# Patient Record
Sex: Female | Born: 1960 | Race: Black or African American | Hispanic: No | Marital: Single | Smoking: Never smoker
Health system: Southern US, Community
[De-identification: ages and names within clinical notes are randomized; demographics above are authoritative.]

## PROBLEM LIST (undated history)

## (undated) DIAGNOSIS — I10 Essential (primary) hypertension: Secondary | ICD-10-CM

## (undated) DIAGNOSIS — E669 Obesity, unspecified: Secondary | ICD-10-CM

## (undated) DIAGNOSIS — M1990 Unspecified osteoarthritis, unspecified site: Secondary | ICD-10-CM

## (undated) HISTORY — PX: NO PAST SURGERIES: SHX2092

## (undated) HISTORY — DX: Unspecified osteoarthritis, unspecified site: M19.90

---

## 2006-09-18 ENCOUNTER — Other Ambulatory Visit: Payer: Self-pay

## 2006-09-18 ENCOUNTER — Emergency Department: Payer: Self-pay | Admitting: Emergency Medicine

## 2006-09-27 ENCOUNTER — Ambulatory Visit: Payer: Self-pay | Admitting: Internal Medicine

## 2006-09-30 ENCOUNTER — Ambulatory Visit: Payer: Self-pay | Admitting: Internal Medicine

## 2008-11-13 ENCOUNTER — Emergency Department: Payer: Self-pay

## 2010-07-04 ENCOUNTER — Ambulatory Visit: Payer: Self-pay | Admitting: Family Medicine

## 2010-07-10 ENCOUNTER — Ambulatory Visit: Payer: Self-pay | Admitting: Family Medicine

## 2011-08-15 ENCOUNTER — Emergency Department: Payer: Self-pay | Admitting: Emergency Medicine

## 2011-08-15 LAB — URINALYSIS, COMPLETE
Bacteria: NONE SEEN
Bilirubin,UR: NEGATIVE
Glucose,UR: NEGATIVE mg/dL (ref 0–75)
Ketone: NEGATIVE
Nitrite: NEGATIVE
Ph: 5 (ref 4.5–8.0)
Specific Gravity: 1.019 (ref 1.003–1.030)
Squamous Epithelial: 7

## 2011-08-15 LAB — BASIC METABOLIC PANEL
Anion Gap: 12 (ref 7–16)
BUN: 10 mg/dL (ref 7–18)
Chloride: 103 mmol/L (ref 98–107)
Co2: 27 mmol/L (ref 21–32)
Creatinine: 0.87 mg/dL (ref 0.60–1.30)
Glucose: 97 mg/dL (ref 65–99)
Potassium: 3.5 mmol/L (ref 3.5–5.1)

## 2011-08-15 LAB — CBC
HCT: 42.2 % (ref 35.0–47.0)
MCH: 28.5 pg (ref 26.0–34.0)
MCHC: 32.4 g/dL (ref 32.0–36.0)
Platelet: 305 10*3/uL (ref 150–440)
RDW: 15.5 % — ABNORMAL HIGH (ref 11.5–14.5)

## 2011-12-19 ENCOUNTER — Ambulatory Visit: Payer: Self-pay

## 2012-01-17 ENCOUNTER — Ambulatory Visit: Payer: Self-pay

## 2012-01-22 ENCOUNTER — Ambulatory Visit: Payer: Self-pay

## 2012-07-31 IMAGING — US ULTRASOUND LEFT BREAST
1 series · 14 of 25 positions shown · non-contrast
Comparison: none

REASON FOR EXAM: us asymmetric density [REDACTED]
COMMENTS:

[Series 1: ultrasound left breast · 0.09mm/px · 35 acquisitions, 14 frames shown]
[im 1/35]
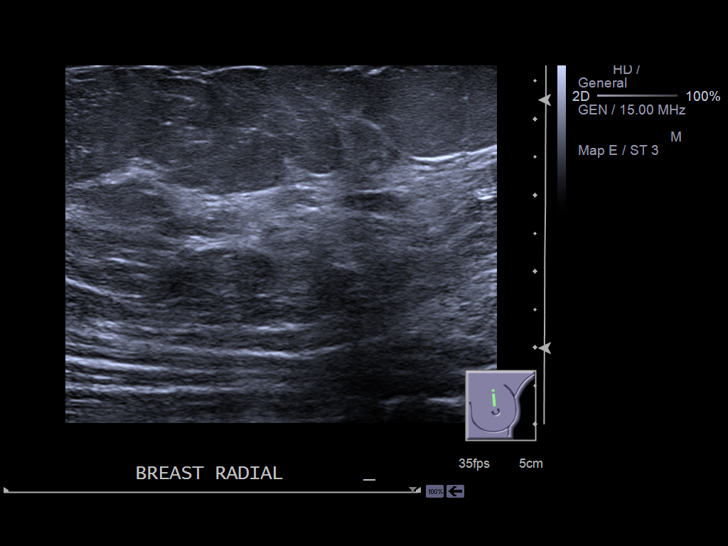
[im 3/35]
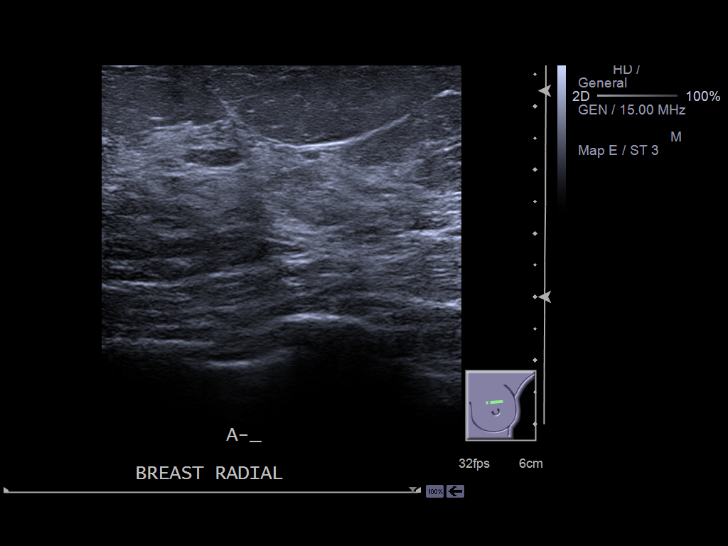
[im 6/35]
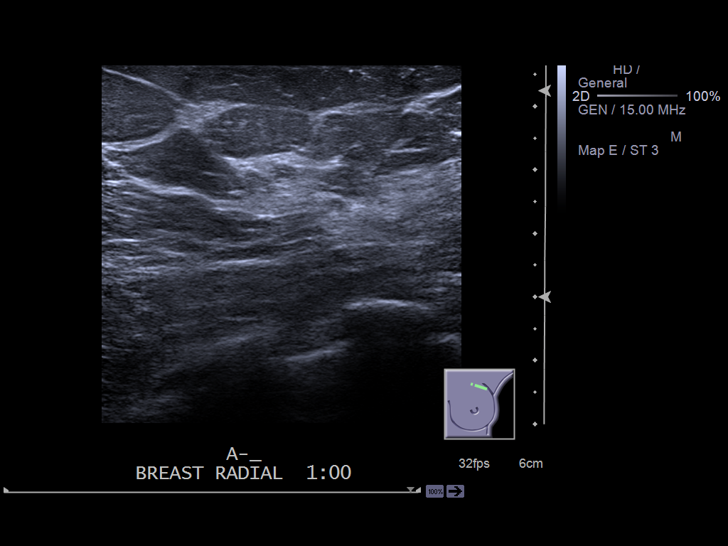
[im 9/35]
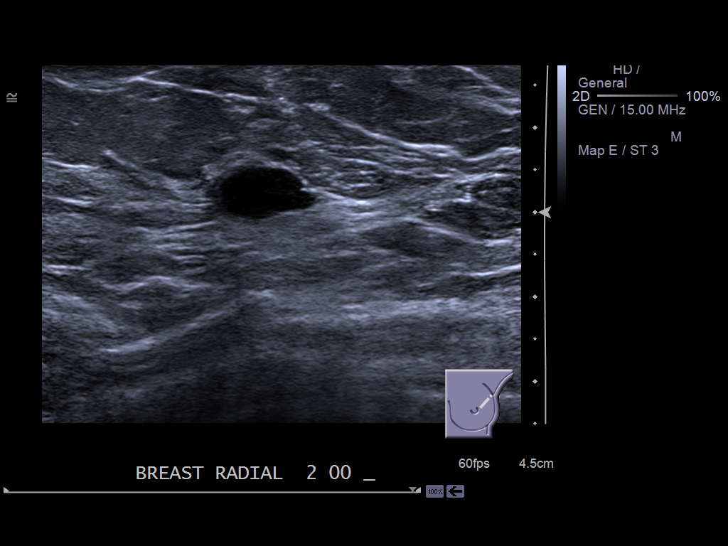
[im 12/35]
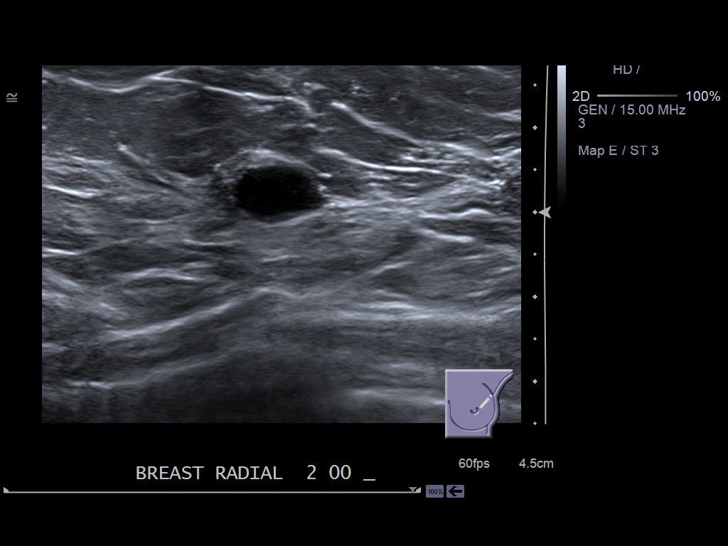
[im 13/35]
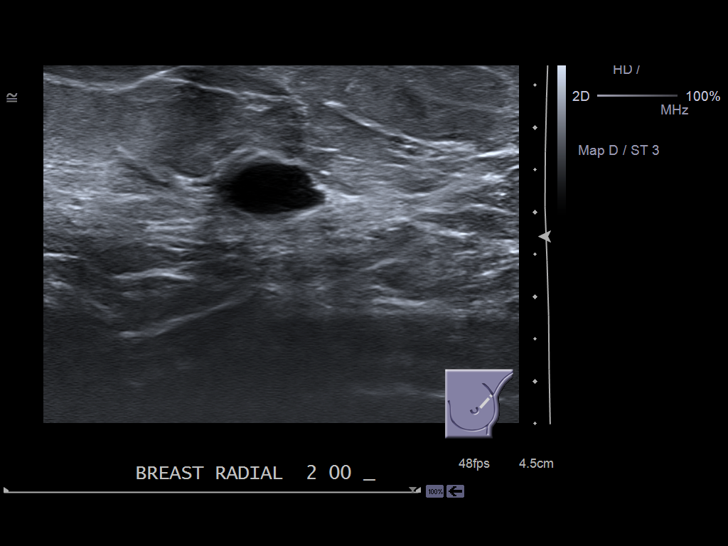
[im 16/35]
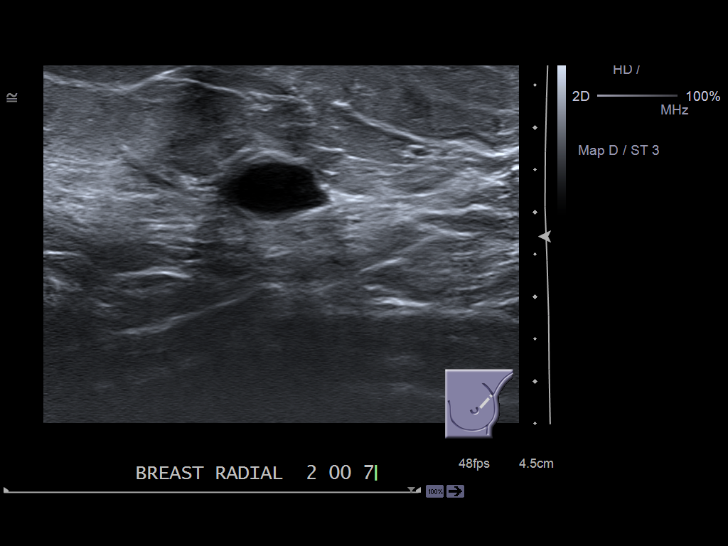
[im 19/35]
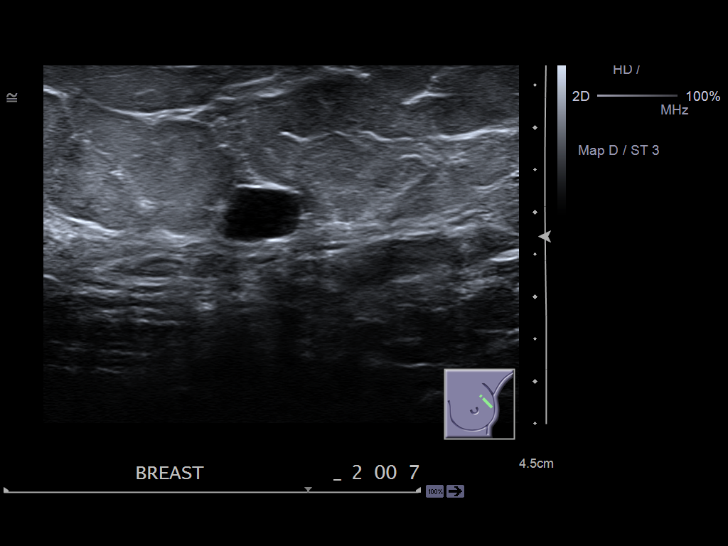
[im 22/35]
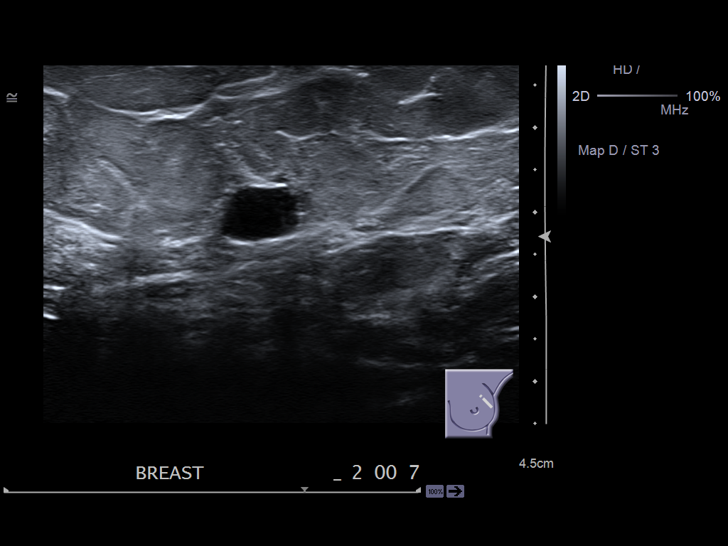
[im 23/35]
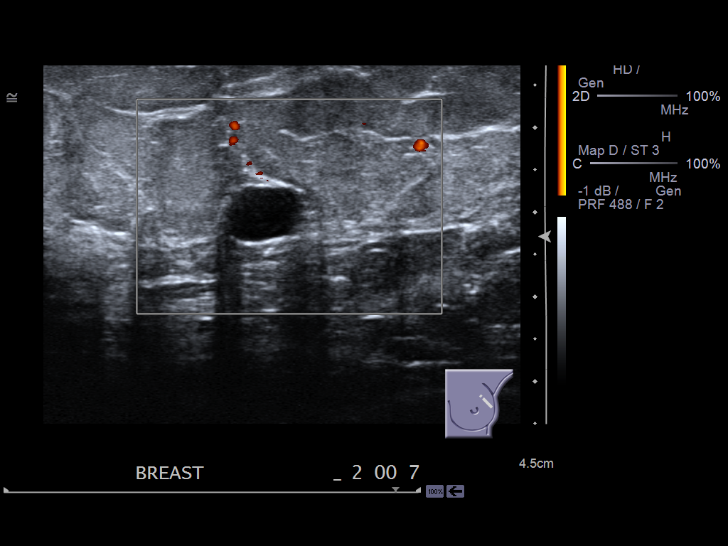
[im 26/35]
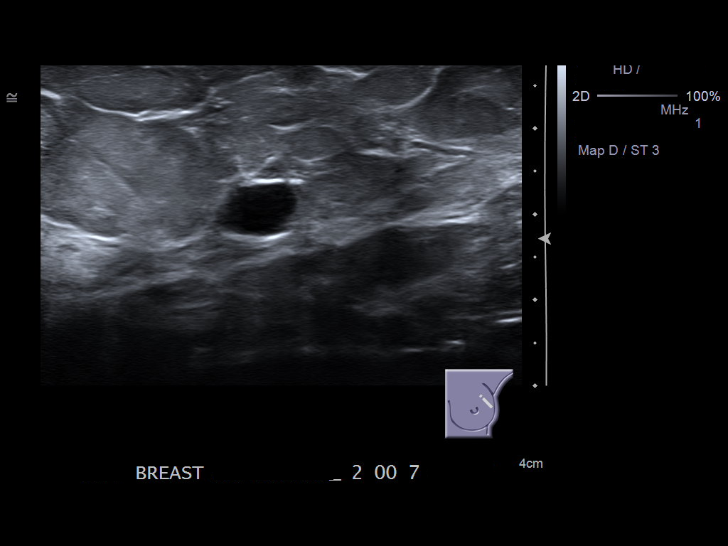
[im 29/35]
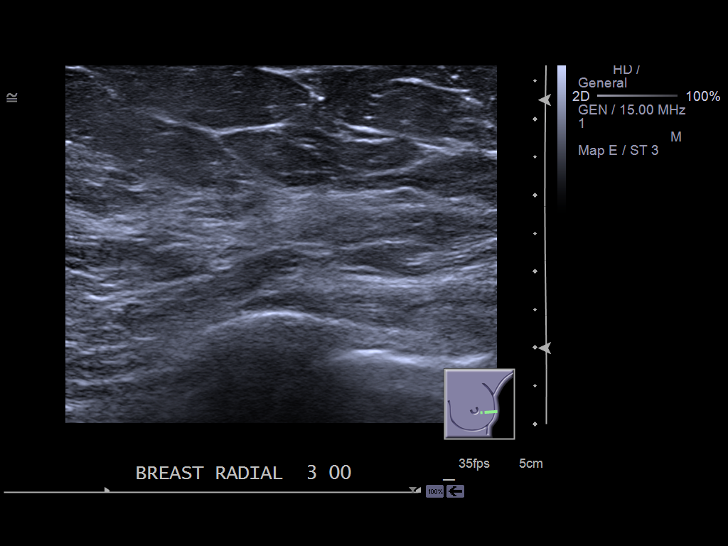
[im 32/35]
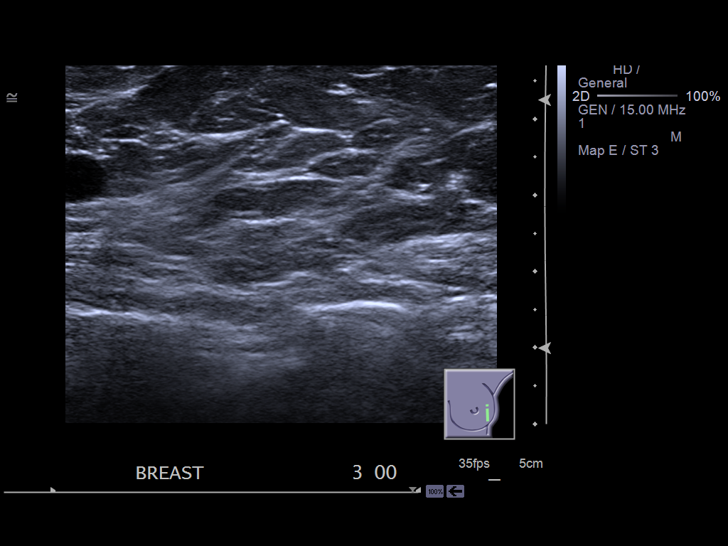
[im 35/35]
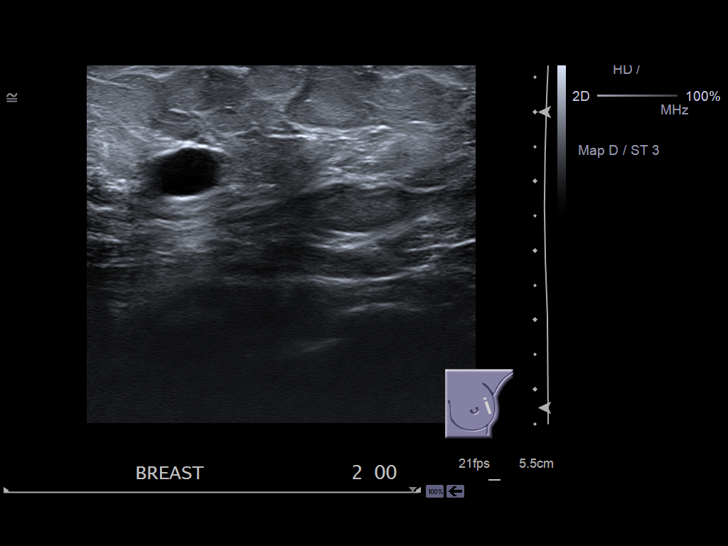

[14 of 25 positions shown; findings below may reference images not displayed]

PROCEDURE:     US  - US LT BREAST ([REDACTED])  - January 22, 2012 [DATE]

RESULT:     The left breast is evaluated from the 12 o'clock to the 3
o'clock position.

At the 2 o'clock position a hypoechoic to anechoic nodule is identified.
There does not appear to be increased through transmission associated with
this nodule nor is there evidence of flow. This nodule has the appearance of
a benign cyst though sonographic characteristics are not pathognomonic.
Please refer to the additional left breast mammogram for completed
discussion.
IMPRESSION: Cystic appearing area likely benign measuring 1.19 x 0.65 x
0.92 cm at the 2 o'clock position as described above.

## 2012-09-05 ENCOUNTER — Ambulatory Visit: Payer: Self-pay | Admitting: Family Medicine

## 2013-01-15 DIAGNOSIS — R600 Localized edema: Secondary | ICD-10-CM | POA: Insufficient documentation

## 2013-01-15 DIAGNOSIS — S86899A Other injury of other muscle(s) and tendon(s) at lower leg level, unspecified leg, initial encounter: Secondary | ICD-10-CM | POA: Insufficient documentation

## 2013-04-08 ENCOUNTER — Ambulatory Visit: Payer: Self-pay

## 2013-04-29 ENCOUNTER — Emergency Department: Payer: Self-pay | Admitting: Emergency Medicine

## 2013-04-29 LAB — CBC
HGB: 13.8 g/dL (ref 12.0–16.0)
MCH: 29.5 pg (ref 26.0–34.0)
MCHC: 33.9 g/dL (ref 32.0–36.0)
MCV: 87 fL (ref 80–100)
Platelet: 270 10*3/uL (ref 150–440)
RBC: 4.67 10*6/uL (ref 3.80–5.20)
RDW: 14.4 % (ref 11.5–14.5)
WBC: 6.2 10*3/uL (ref 3.6–11.0)

## 2013-04-29 LAB — COMPREHENSIVE METABOLIC PANEL
Albumin: 3.3 g/dL — ABNORMAL LOW (ref 3.4–5.0)
Anion Gap: 6 — ABNORMAL LOW (ref 7–16)
Bilirubin,Total: 0.3 mg/dL (ref 0.2–1.0)
Calcium, Total: 9.1 mg/dL (ref 8.5–10.1)
Chloride: 105 mmol/L (ref 98–107)
Co2: 26 mmol/L (ref 21–32)
EGFR (Non-African Amer.): >60
Osmolality: 275 (ref 275–301)
Potassium: 3.4 mmol/L — ABNORMAL LOW (ref 3.5–5.1)
SGOT(AST): 30 U/L (ref 15–37)
SGPT (ALT): 32 U/L (ref 12–78)
Sodium: 137 mmol/L (ref 136–145)
Total Protein: 7.7 g/dL (ref 6.4–8.2)

## 2013-04-29 LAB — APTT: Activated PTT: 33.1 secs (ref 23.6–35.9)

## 2013-04-29 LAB — PROTIME-INR: INR: 1

## 2013-05-07 ENCOUNTER — Emergency Department: Payer: Self-pay | Admitting: Emergency Medicine

## 2013-05-07 LAB — CBC
HGB: 13.7 g/dL (ref 12.0–16.0)
MCH: 28.8 pg (ref 26.0–34.0)
MCV: 87 fL (ref 80–100)
Platelet: 322 10*3/uL (ref 150–440)
RBC: 4.75 10*6/uL (ref 3.80–5.20)
WBC: 12.3 10*3/uL — ABNORMAL HIGH (ref 3.6–11.0)

## 2013-05-07 LAB — COMPREHENSIVE METABOLIC PANEL
Alkaline Phosphatase: 74 U/L (ref 50–136)
BUN: 12 mg/dL (ref 7–18)
Bilirubin,Total: 0.4 mg/dL (ref 0.2–1.0)
Chloride: 106 mmol/L (ref 98–107)
Co2: 25 mmol/L (ref 21–32)
Creatinine: 1.09 mg/dL (ref 0.60–1.30)
EGFR (African American): >60
Glucose: 135 mg/dL — ABNORMAL HIGH (ref 65–99)
Osmolality: 276 (ref 275–301)
SGOT(AST): 101 U/L — ABNORMAL HIGH (ref 15–37)
SGPT (ALT): 119 U/L — ABNORMAL HIGH (ref 12–78)
Sodium: 137 mmol/L (ref 136–145)
Total Protein: 8.8 g/dL — ABNORMAL HIGH (ref 6.4–8.2)

## 2013-05-07 LAB — URINALYSIS, COMPLETE
Bilirubin,UR: NEGATIVE
Nitrite: NEGATIVE
Ph: 5 (ref 4.5–8.0)
Protein: 100
Specific Gravity: 1.028 (ref 1.003–1.030)
Squamous Epithelial: 3

## 2013-05-11 ENCOUNTER — Emergency Department: Payer: Self-pay | Admitting: Emergency Medicine

## 2013-05-11 LAB — COMPREHENSIVE METABOLIC PANEL
Albumin: 3.6 g/dL (ref 3.4–5.0)
Alkaline Phosphatase: 76 U/L (ref 50–136)
BUN: 16 mg/dL (ref 7–18)
Bilirubin,Total: 0.5 mg/dL (ref 0.2–1.0)
Co2: 25 mmol/L (ref 21–32)
Creatinine: 1.19 mg/dL (ref 0.60–1.30)
EGFR (African American): >60
EGFR (Non-African Amer.): 53 — ABNORMAL LOW
Glucose: 112 mg/dL — ABNORMAL HIGH (ref 65–99)
SGPT (ALT): 409 U/L — ABNORMAL HIGH (ref 12–78)
Sodium: 136 mmol/L (ref 136–145)

## 2013-05-11 LAB — CBC
HCT: 46.9 % (ref 35.0–47.0)
HGB: 15.5 g/dL (ref 12.0–16.0)
MCH: 28.3 pg (ref 26.0–34.0)
MCHC: 32.9 g/dL (ref 32.0–36.0)
RBC: 5.46 10*6/uL — ABNORMAL HIGH (ref 3.80–5.20)
RDW: 14.4 % (ref 11.5–14.5)

## 2013-05-11 LAB — HCG, QUANTITATIVE, PREGNANCY: Beta Hcg, Quant.: <1 m[IU]/mL — ABNORMAL LOW

## 2013-05-11 LAB — WET PREP, GENITAL

## 2014-06-02 ENCOUNTER — Ambulatory Visit: Payer: Self-pay

## 2014-12-07 ENCOUNTER — Ambulatory Visit
Admit: 2014-12-07 | Disposition: A | Discharge status: Home / Self Care | Payer: Self-pay | Attending: Family Medicine | Admitting: Family Medicine

## 2015-02-09 ENCOUNTER — Encounter: Payer: Self-pay | Admitting: Obstetrics and Gynecology

## 2015-05-02 ENCOUNTER — Encounter: Payer: Self-pay | Admitting: Emergency Medicine

## 2015-05-02 ENCOUNTER — Emergency Department
Admission: EM | Admit: 2015-05-02 | Discharge: 2015-05-02 | Disposition: A | Discharge status: Home / Self Care | Payer: No Typology Code available for payment source | Attending: Student | Admitting: Student

## 2015-05-02 DIAGNOSIS — L259 Unspecified contact dermatitis, unspecified cause: Secondary | ICD-10-CM | POA: Insufficient documentation

## 2015-05-02 DIAGNOSIS — Z79899 Other long term (current) drug therapy: Secondary | ICD-10-CM | POA: Insufficient documentation

## 2015-05-02 DIAGNOSIS — I10 Essential (primary) hypertension: Secondary | ICD-10-CM | POA: Insufficient documentation

## 2015-05-02 DIAGNOSIS — R220 Localized swelling, mass and lump, head: Secondary | ICD-10-CM | POA: Diagnosis present

## 2015-05-02 DIAGNOSIS — L03211 Cellulitis of face: Secondary | ICD-10-CM | POA: Diagnosis not present

## 2015-05-02 HISTORY — DX: Essential (primary) hypertension: I10

## 2015-05-02 MED ORDER — TRAMADOL HCL 50 MG PO TABS: 50.0000 mg | ORAL_TABLET | Freq: Four times a day (QID) | ORAL | Status: DC | PRN

## 2015-05-02 MED ORDER — IBUPROFEN 800 MG PO TABS: 800.0000 mg | ORAL_TABLET | Freq: Three times a day (TID) | ORAL | Status: DC | PRN

## 2015-05-02 MED ORDER — HYDROCORTISONE VALERATE 0.2 % EX OINT: TOPICAL_OINTMENT | CUTANEOUS | Status: AC

## 2015-05-02 MED ORDER — HYDROXYZINE HCL 50 MG PO TABS: 50.0000 mg | ORAL_TABLET | Freq: Three times a day (TID) | ORAL | Status: DC | PRN

## 2015-05-02 MED ORDER — SULFAMETHOXAZOLE-TRIMETHOPRIM 800-160 MG PO TABS: 1.0000 | ORAL_TABLET | Freq: Two times a day (BID) | ORAL | Status: DC

## 2015-05-02 NOTE — ED Notes (Signed)
Pt to ed with c/o swelling to right side of face near nose.  Pt with redness noted.  Tender to touch.  Pt also reports rash on neck x 2 weeks.

## 2015-05-02 NOTE — ED Notes (Signed)
Redness and swelling noted to right side of face for the past couple of days. Also swelling noted to gumline. No fever

## 2015-05-02 NOTE — ED Provider Notes (Signed)
[facility] Emergency Department Provider Note  ____________________________________________  Time seen: Approximately 9:02 AM  I have reviewed the triage vital signs and the nursing notes.   HISTORY  Chief Complaint Facial Swelling    HPI Leah Stephens is a 54 y.o. female patient is complaining of onset of right-sided facial edema and redness. Patient stated onset was yesterday. Patient denies any provocative incident for this complaint. Patient denies dental pain.  Patient also concern about a rash around the neck. Patient has circular rash which is itching. Patient denies wearing any jewelry but states she weighs air pulse attention by a plastic or nylon lesions. Patient states that the rash itches and has no discharge. Patient states use over-the-counter hydrocortisone cream to the rash.   Past Medical History  Diagnosis Date  . Hypertension     There are no active problems to display for this patient.   History reviewed. No pertinent past surgical history.  Current Outpatient Rx  Name  Route  Sig  Dispense  Refill  . lisinopril (PRINIVIL,ZESTRIL) 20 MG tablet   Oral   Take 20 mg by mouth daily.           Allergies Review of patient's allergies indicates no known allergies.  History reviewed. No pertinent family history.  Social History Social History  Substance Use Topics  . Smoking status: Never Smoker   . Smokeless tobacco: None  . Alcohol Use: Yes    Review of Systems Constitutional: No fever/chills Eyes: No visual changes. ENT: No sore throat. Cardiovascular: Denies chest pain. Respiratory: Denies shortness of breath. Gastrointestinal: No abdominal pain.  No nausea, no vomiting.  No diarrhea.  No constipation. Genitourinary: Negative for dysuria. Musculoskeletal: Negative for back pain. Skin:  right maxillary area and a rash around the neck. Neurological: Negative for headaches, focal weakness or numbness. 10-point ROS  otherwise negative.  ____________________________________________   PHYSICAL EXAM:  VITAL SIGNS: ED Triage Vitals  Enc Vitals Group     BP 05/02/15 0842 136/74 mmHg     Pulse Rate 05/02/15 0842 72     Resp 05/02/15 0842 20     Temp 05/02/15 0842 98.5 F (36.9 C)     Temp Source 05/02/15 0842 Oral     SpO2 05/02/15 0842 100 %     Weight 05/02/15 0842 230 lb (104.327 kg)     Height 05/02/15 0842 4\' 11"  (1.499 m)     Head Cir --      Peak Flow --      Pain Score 05/02/15 0842 10     Pain Loc --      Pain Edu? --      Excl. in [place]? --     Constitutional: Alert and oriented. Well appearing and in no acute distress. Eyes: Conjunctivae are normal. PERRL. EOMI. Head: Atraumatic. Nose: No congestion/rhinnorhea. Mouth/Throat: Mucous membranes are moist.  Oropharynx non-erythematous. Neck: No stridor.   Cardiovascular: Normal rate, regular rhythm. Grossly normal heart sounds.  Good peripheral circulation. Respiratory: Normal respiratory effort.  No retractions. Lungs CTAB. Gastrointestinal: Soft and nontender. No distention. No abdominal bruits. No CVA tenderness. Musculoskeletal: No lower extremity tenderness nor edema.  No joint effusions. Neurologic:  Normal speech and language. No gross focal neurologic deficits are appreciated. No gait instability. Skin: Erythema and edema to the right maxillary area. Moderate guarding palpation. Patient also has a macular lesions comfortable of the posterior and bilateral areas of the neck. Psychiatric: Mood and affect are normal. Speech and  behavior are normal.  ____________________________________________   LABS (all labs ordered are listed, but only abnormal results are displayed)  Labs Reviewed - No data to display ____________________________________________  EKG   ____________________________________________  RADIOLOGY   ____________________________________________   PROCEDURES  Procedure(s) performed: None  Critical  Care performed: No  ____________________________________________   INITIAL IMPRESSION / ASSESSMENT AND PLAN / ED COURSE  Pertinent labs & imaging results that were available during my care of the patient were reviewed by me and considered in my medical decision making (see chart for details). Right facial cellulitis and contact dermatitis to the neck. Patient given a prescription for Bactrim, tramadol, Atarax, and hydrocortisone. Patient advised to cut the least attaches to earplugs. Patient given a work excuse for today. [facility]. ____________________________________________   FINAL CLINICAL IMPRESSION(S) / ED DIAGNOSES  Final diagnoses:  Facial cellulitis  Contact dermatitis      Sable Feil, PA-C 05/02/15 [id number]  Joanne Gavel, MD 05/02/15 1635

## 2015-05-03 ENCOUNTER — Emergency Department
Admission: EM | Admit: 2015-05-03 | Discharge: 2015-05-03 | Disposition: A | Discharge status: Home / Self Care | Payer: No Typology Code available for payment source | Attending: Emergency Medicine | Admitting: Emergency Medicine

## 2015-05-03 ENCOUNTER — Emergency Department: Payer: No Typology Code available for payment source

## 2015-05-03 ENCOUNTER — Telehealth: Payer: Self-pay | Admitting: Emergency Medicine

## 2015-05-03 ENCOUNTER — Encounter: Payer: Self-pay | Admitting: Emergency Medicine

## 2015-05-03 DIAGNOSIS — I10 Essential (primary) hypertension: Secondary | ICD-10-CM | POA: Diagnosis not present

## 2015-05-03 DIAGNOSIS — R220 Localized swelling, mass and lump, head: Secondary | ICD-10-CM | POA: Diagnosis present

## 2015-05-03 DIAGNOSIS — R609 Edema, unspecified: Secondary | ICD-10-CM

## 2015-05-03 DIAGNOSIS — R21 Rash and other nonspecific skin eruption: Secondary | ICD-10-CM | POA: Diagnosis not present

## 2015-05-03 DIAGNOSIS — L0201 Cutaneous abscess of face: Secondary | ICD-10-CM | POA: Insufficient documentation

## 2015-05-03 DIAGNOSIS — Z79899 Other long term (current) drug therapy: Secondary | ICD-10-CM | POA: Diagnosis not present

## 2015-05-03 LAB — BASIC METABOLIC PANEL
ANION GAP: 8 (ref 5–15)
BUN: 12 mg/dL (ref 6–20)
CO2: 25 mmol/L (ref 22–32)
Calcium: 9.6 mg/dL (ref 8.9–10.3)
Chloride: 105 mmol/L (ref 101–111)
Creatinine, Ser: 1 mg/dL (ref 0.44–1.00)
GFR calc Af Amer: >60 mL/min (ref >60)
GFR calc non Af Amer: >60 mL/min (ref >60)
GLUCOSE: 94 mg/dL (ref 65–99)
POTASSIUM: 3.8 mmol/L (ref 3.5–5.1)
Sodium: 138 mmol/L (ref 135–145)

## 2015-05-03 LAB — CBC WITH DIFFERENTIAL/PLATELET
BASOS ABS: 0.1 10*3/uL (ref 0–0.1)
Basophils Relative: 1 %
Eosinophils Absolute: 0.1 10*3/uL (ref 0–0.7)
Eosinophils Relative: 1 %
HEMATOCRIT: 45 % (ref 35.0–47.0)
Hemoglobin: 14.7 g/dL (ref 12.0–16.0)
LYMPHS PCT: 18 %
Lymphs Abs: 2.1 10*3/uL (ref 1.0–3.6)
MCH: 28.2 pg (ref 26.0–34.0)
MCHC: 32.7 g/dL (ref 32.0–36.0)
MCV: 86.3 fL (ref 80.0–100.0)
MONO ABS: 1.1 10*3/uL — AB (ref 0.2–0.9)
Monocytes Relative: 9 %
NEUTROS ABS: 8.5 10*3/uL — AB (ref 1.4–6.5)
Neutrophils Relative %: 71 %
Platelets: 269 10*3/uL (ref 150–440)
RBC: 5.21 MIL/uL — AB (ref 3.80–5.20)
RDW: 15 % — ABNORMAL HIGH (ref 11.5–14.5)
WBC: 11.9 10*3/uL — AB (ref 3.6–11.0)

## 2015-05-03 MED ORDER — IOHEXOL 300 MG/ML  SOLN
75.0000 mL | Freq: Once | INTRAMUSCULAR | Status: AC | PRN
  Administered 2015-05-03: 75 mL via INTRAVENOUS

## 2015-05-03 MED ORDER — CLINDAMYCIN HCL 300 MG PO CAPS: 300.0000 mg | ORAL_CAPSULE | Freq: Three times a day (TID) | ORAL | Status: DC

## 2015-05-03 MED ORDER — LIDOCAINE-EPINEPHRINE 1 %-1:100000 IJ SOLN
20.0000 mL | Freq: Once | INTRAMUSCULAR | Status: AC
  Administered 2015-05-03: 20 mL
  Filled 2015-05-03: qty 20

## 2015-05-03 MED ORDER — CLINDAMYCIN PHOSPHATE 600 MG/50ML IV SOLN
600.0000 mg | Freq: Once | INTRAVENOUS | Status: AC
  Administered 2015-05-03: 600 mg via INTRAVENOUS
  Filled 2015-05-03: qty 50

## 2015-05-03 MED ORDER — OXYCODONE-ACETAMINOPHEN 5-325 MG PO TABS: 1.0000 | ORAL_TABLET | Freq: Four times a day (QID) | ORAL | Status: DC | PRN

## 2015-05-03 MED ORDER — OXYCODONE-ACETAMINOPHEN 5-325 MG PO TABS
1.0000 | ORAL_TABLET | Freq: Once | ORAL | Status: AC
  Administered 2015-05-03: 1 via ORAL
  Filled 2015-05-03: qty 1

## 2015-05-03 MED ORDER — MORPHINE SULFATE (PF) 2 MG/ML IV SOLN
2.0000 mg | Freq: Once | INTRAVENOUS | Status: AC
  Administered 2015-05-03: 2 mg via INTRAVENOUS
  Filled 2015-05-03: qty 1

## 2015-05-03 MED ORDER — SODIUM CHLORIDE 0.9 % IV BOLUS (SEPSIS)
500.0000 mL | Freq: Once | INTRAVENOUS | Status: AC
  Administered 2015-05-03: 500 mL via INTRAVENOUS

## 2015-05-03 MED ORDER — CHLORHEXIDINE GLUCONATE 0.12 % MT SOLN: 15.0000 mL | Freq: Two times a day (BID) | OROMUCOSAL | Status: DC

## 2015-05-03 NOTE — ED Notes (Signed)
Pt was seen here yesterday, d/c with sinus infection and sent home with hydroxizine and bactrim. Pt here today with right sided facial swelling (lip, cheek and eye)

## 2015-05-03 NOTE — ED Provider Notes (Addendum)
[facility] Emergency Department Provider Note  ____________________________________________  Time seen: Approximately 6:31 PM  I have reviewed the triage vital signs and the nursing notes.   HISTORY  Chief Complaint Facial Swelling    HPI Leah Stephens is a 54 y.o. female presents today complains of facial swelling. She states that she has had facial swelling since yesterday. It got much worse. She is taking the Bactrim. She denies any fever or vomiting or difficulty breathing. She also has dental pain.  Past Medical History  Diagnosis Date  . Hypertension     There are no active problems to display for this patient.   History reviewed. No pertinent past surgical history.  Current Outpatient Rx  Name  Route  Sig  Dispense  Refill  . hydrocortisone valerate ointment (WESTCORT) 0.2 %      Apply to affected area daily   45 g   1   . hydrOXYzine (ATARAX/VISTARIL) 50 MG tablet   Oral   Take 1 tablet (50 mg total) by mouth 3 (three) times daily as needed for itching.   15 tablet   0   . ibuprofen (ADVIL,MOTRIN) 800 MG tablet   Oral   Take 1 tablet (800 mg total) by mouth every 8 (eight) hours as needed for moderate pain.   15 tablet   0   . lisinopril (PRINIVIL,ZESTRIL) 20 MG tablet   Oral   Take 20 mg by mouth daily.         Marland Kitchen sulfamethoxazole-trimethoprim (BACTRIM DS,SEPTRA DS) 800-160 MG per tablet   Oral   Take 1 tablet by mouth 2 (two) times daily.   20 tablet   0   . traMADol (ULTRAM) 50 MG tablet   Oral   Take 1 tablet (50 mg total) by mouth every 6 (six) hours as needed for moderate pain.   12 tablet   0     Allergies Review of patient's allergies indicates no known allergies.  No family history on file.  Social History Social History  Substance Use Topics  . Smoking status: Never Smoker   . Smokeless tobacco: None  . Alcohol Use: Yes    Review of Systems Constitutional: No fever/chills Eyes: No visual  changes. ENT: No sore throat. Cardiovascular: Denies chest pain. Respiratory: Denies shortness of breath. Gastrointestinal: No abdominal pain.  No nausea, no vomiting.  No diarrhea.  No constipation. Genitourinary: Negative for dysuria. Musculoskeletal: Negative for back pain. Skin: Negative for rash. Has a chronic rash from a skin reaction around her neck which is in her opinion unrelated Neurological: Negative for headaches, focal weakness or numbness.  10-point ROS otherwise negative.  ____________________________________________   PHYSICAL EXAM:  VITAL SIGNS: ED Triage Vitals  Enc Vitals Group     BP 05/03/15 1510 126/81 mmHg     Pulse Rate 05/03/15 1510 108     Resp 05/03/15 1510 16     Temp 05/03/15 1510 98.7 F (37.1 C)     Temp Source 05/03/15 1510 Oral     SpO2 05/03/15 1510 100 %     Weight 05/03/15 1510 225 lb (102.059 kg)     Height 05/03/15 1510 4\' 11"  (1.499 m)     Head Cir --      Peak Flow --      Pain Score 05/03/15 1511 10     Pain Loc --      Pain Edu? --      Excl. in [place]? --  Constitutional: Alert and oriented. Well appearing and in no acute distress. Eyes: Conjunctivae are normal. PERRL. EOMI. Head: Atraumatic. Nose: No congestion/rhinnorhea. Face: There is evidence of an abscess to the right face with pain swelling and mild erythema with no significant cellulitis noted Mouth/Throat: Mucous membranes are moist.  Oropharynx non-erythematous. There is pain and swelling above the right upper mid teeth. Neck: No stridor.   Cardiovascular: Normal rate, regular rhythm. Grossly normal heart sounds.  Good peripheral circulation. Respiratory: Normal respiratory effort.  No retractions. Lungs CTAB. Gastrointestinal: Soft and nontender. No distention. No abdominal bruits. No CVA tenderness. Musculoskeletal: No lower extremity tenderness nor edema.  No joint effusions. Neurologic:  Normal speech and language. No gross focal neurologic deficits are  appreciated. No gait instability. Skin:  Skin is warm, dry and intact. No rash noted. Psychiatric: Mood and affect are normal. Speech and behavior are normal.  ____________________________________________   LABS (all labs ordered are listed, but only abnormal results are displayed)  Labs Reviewed  CBC WITH DIFFERENTIAL/PLATELET - Abnormal; Notable for the following:    WBC 11.9 (*)    RBC 5.21 (*)    RDW 15.0 (*)    Neutro Abs 8.5 (*)    Monocytes Absolute 1.1 (*)    All other components within normal limits  WOUND CULTURE  BASIC METABOLIC PANEL   ____________________________________________  EKG   ____________________________________________  RADIOLOGY I have reviewed x-rays  ____________________________________________   PROCEDURES  Procedure(s) performed: None  Critical Care performed:  None  ____________________________________________   INITIAL IMPRESSION / ASSESSMENT AND PLAN / ED COURSE  Pertinent labs & imaging results that were available during my care of the patient were reviewed by me and considered in my medical decision making (see chart for details).  Patient with a clinically evident facial abscess despite antibiotics. I discussed with the on-call ENT Dr. Tami Ribas who advised me to perform an I&D. I did describe the size and position of the lesion. Patient does consent to the procedure. I have given her IV antibiotics, clindamycin, which is likely a better choice given the now known dental origin and we will reassess.  ----------------------------------------- 8:06 PM on 05/03/2015 -----------------------------------------  Patient gave verbal consent for the procedure. Risks benefits and alternatives were explained included there was a local structures, bleeding, and infection. Patient understood risks benefits and alternatives and consented the procedure. Patient was then given lidocaine, 1% with epinephrine, she was given a total of 5 cc which  resulted in good analgesia at the gumline. A #15 blade scalpel was then incised approximately 1 cm, with good purulent return. 2 attempts were made. The patient tolerated the procedure well, she suffered no pain, leaving was controlled with direct pressure. The patient's facial swelling immediately resolved. No evidence of systemic illness or sepsis. She did receive IV clindamycin here. We'll send her home with clindamycin and return precautions. We will advise follow-up with this department tomorrow for recheck as well as with her primary care doctor./ENT. Patient understands she must return for increased pain or swelling or fever.   ____________________________________________   FINAL CLINICAL IMPRESSION(S) / ED DIAGNOSES  Final diagnoses:  Swelling     Schuyler Amor, MD 05/03/15 [place], MD 05/03/15 2007

## 2015-05-03 NOTE — ED Notes (Signed)
MD at bedside.  Dr. Burlene Arnt in room to assess patient.  Will continue to monitor.

## 2015-05-03 NOTE — ED Notes (Signed)
Leah Stephens called to say her facial swelling is getting worse and includes her lips and eyes now.  She has pcp at [facility].   Advised to call her doctor, and if they are unable to see her to come here for recheck today.  Pt agrees.

## 2015-05-03 NOTE — ED Notes (Signed)
Pt seen yesterday for sinus infection, presents today with right facial and lip swelling. Denies shortness of breath and difficulty swallowing.

## 2015-05-03 NOTE — Discharge Instructions (Signed)
If you have increased pain, swelling, fever, difficulty breathing, or you feel worse in any way return immediately to medical attention. Take the antibiotics as prescribed and follow closely with [place] tomorrow for a recheck of your face. He may also consider following up with ENT tomorrow if you can get in.  Abscess An abscess is an infected area that contains a collection of pus and debris.It can occur in almost any part of the body. An abscess is also known as a furuncle or boil. CAUSES  An abscess occurs when tissue gets infected. This can occur from blockage of oil or sweat glands, infection of hair follicles, or a minor injury to the skin. As the body tries to fight the infection, pus collects in the area and creates pressure under the skin. This pressure causes pain. People with weakened immune systems have difficulty fighting infections and get certain abscesses more often.  SYMPTOMS Usually an abscess develops on the skin and becomes a painful mass that is red, warm, and tender. If the abscess forms under the skin, you may feel a moveable soft area under the skin. Some abscesses break open (rupture) on their own, but most will continue to get worse without care. The infection can spread deeper into the body and eventually into the bloodstream, causing you to feel ill.  DIAGNOSIS  Your caregiver will take your medical history and perform a physical exam. A sample of fluid may also be taken from the abscess to determine what is causing your infection. TREATMENT  Your caregiver may prescribe antibiotic medicines to fight the infection. However, taking antibiotics alone usually does not cure an abscess. Your caregiver may need to make a small cut (incision) in the abscess to drain the pus. In some cases, gauze is packed into the abscess to reduce pain and to continue draining the area. HOME CARE INSTRUCTIONS   Only take over-the-counter or prescription medicines for pain, discomfort, or fever as  directed by your caregiver.  If you were prescribed antibiotics, take them as directed. Finish them even if you start to feel better.  If gauze is used, follow your caregiver's directions for changing the gauze.  To avoid spreading the infection:  Keep your draining abscess covered with a bandage.  Wash your hands well.  Do not share personal care items, towels, or whirlpools with others.  Avoid skin contact with others.  Keep your skin and clothes clean around the abscess.  Keep all follow-up appointments as directed by your caregiver. SEEK MEDICAL CARE IF:   You have increased pain, swelling, redness, fluid drainage, or bleeding.  You have muscle aches, chills, or a general ill feeling.  You have a fever. MAKE SURE YOU:   Understand these instructions.  Will watch your condition.  Will get help right away if you are not doing well or get worse. Document Released: 05/09/2005 Document Revised: 01/29/2012 Document Reviewed: 10/12/2011 [facility] Patient Information 2015 [place], [place]. This information is not intended to replace advice given to you by your health care provider. Make sure you discuss any questions you have with your health care provider.

## 2015-10-28 ENCOUNTER — Other Ambulatory Visit: Payer: Self-pay | Admitting: Family Medicine

## 2015-10-28 DIAGNOSIS — Z1231 Encounter for screening mammogram for malignant neoplasm of breast: Secondary | ICD-10-CM

## 2016-10-22 ENCOUNTER — Ambulatory Visit: Payer: Self-pay

## 2016-11-05 ENCOUNTER — Ambulatory Visit
Admission: RE | Admit: 2016-11-05 | Discharge: 2016-11-05 | Disposition: A | Discharge status: Home / Self Care | Payer: Self-pay | Source: Ambulatory Visit | Attending: Oncology | Admitting: Oncology

## 2016-11-05 ENCOUNTER — Ambulatory Visit: Payer: Self-pay | Attending: Oncology | Admitting: *Deleted

## 2016-11-05 ENCOUNTER — Encounter: Payer: Self-pay | Admitting: *Deleted

## 2016-11-05 ENCOUNTER — Encounter (INDEPENDENT_AMBULATORY_CARE_PROVIDER_SITE_OTHER): Payer: Self-pay

## 2016-11-05 VITALS — BP 123/75 | HR 67 | Temp 97.8°F | Ht 61.0 in | Wt 226.0 lb

## 2016-11-05 DIAGNOSIS — Z0000 Encounter for general adult medical examination without abnormal findings: Secondary | ICD-10-CM

## 2016-11-05 NOTE — Progress Notes (Signed)
Subjective:     Patient ID: Leah Stephens, female   DOB: 03-01-1961, 56 y.o.   MRN: [id number]  HPI   Review of Systems     Objective:   Physical Exam  Pulmonary/Chest: Right breast exhibits no inverted nipple, no mass, no nipple discharge, no skin change and no tenderness. Left breast exhibits no inverted nipple, no mass, no nipple discharge, no skin change and no tenderness. Breasts are symmetrical.       Assessment:     56 year old Black female presents to [facility] for clinical breast exam and mammogram.  Clinical breast exam unremarkable.  Taught self breast awareness.  Last pap was -/- on 12/16/15.  Next pap due 2022.  Patient has been screened for eligibility.  She does not have any insurance, Medicare or Medicaid.  She also meets financial eligibility.  Hand-out given on the Affordable Care Act.    Plan:     Screening mammogram ordered.  Will follow-up per [facility] protocol.

## 2016-11-05 NOTE — Patient Instructions (Signed)
Gave patient hand-out, Women Staying Healthy, Active and Well from [facility], with education on breast health, pap smears, heart and colon health. 

## 2016-11-07 ENCOUNTER — Encounter: Payer: Self-pay | Admitting: *Deleted

## 2016-11-07 NOTE — Progress Notes (Signed)
Letter mailed from the [facility] to inform patient of her normal mammogram results.  Patient is to follow-up with annual screening in one year.  HSIS to [place]. 

## 2018-02-19 ENCOUNTER — Ambulatory Visit: Payer: Self-pay | Attending: Oncology | Admitting: *Deleted

## 2018-02-19 ENCOUNTER — Encounter (INDEPENDENT_AMBULATORY_CARE_PROVIDER_SITE_OTHER): Payer: Self-pay

## 2018-02-19 ENCOUNTER — Ambulatory Visit
Admission: RE | Admit: 2018-02-19 | Discharge: 2018-02-19 | Disposition: A | Discharge status: Home / Self Care | Payer: Self-pay | Source: Ambulatory Visit | Attending: Oncology | Admitting: Oncology

## 2018-02-19 ENCOUNTER — Encounter: Payer: Self-pay | Admitting: *Deleted

## 2018-02-19 ENCOUNTER — Other Ambulatory Visit: Payer: Self-pay

## 2018-02-19 VITALS — BP 116/73 | HR 62 | Temp 97.3°F | Ht 59.0 in | Wt 218.0 lb

## 2018-02-19 DIAGNOSIS — Z1211 Encounter for screening for malignant neoplasm of colon: Secondary | ICD-10-CM

## 2018-02-19 NOTE — Patient Instructions (Signed)
Gave patient hand-out, Women Staying Healthy, Active and Well from [facility], with education on breast health, pap smears, heart and colon health. 

## 2018-02-19 NOTE — Progress Notes (Signed)
Letter mailed from the [facility] to inform patient of her normal mammogram results.  Patient is to follow-up with annual screening in one year.  HSIS to [place]. 

## 2018-02-19 NOTE — Progress Notes (Signed)
  Subjective:     Patient ID: [name], female   DOB: 10-01-1960, 57 y.o.   MRN: [id number]  HPI   Review of Systems     Objective:   Physical Exam  Pulmonary/Chest: Right breast exhibits no inverted nipple, no mass, no nipple discharge, no skin change and no tenderness. Left breast exhibits no inverted nipple, no mass, no nipple discharge, no skin change and no tenderness.       Assessment:     57 year old female from [place] returns to [facility] for annual screening.  Clinical breast exam unremarkable.  Taught self breast awareness.  Last pap on 12/16/15 was negative/negative.  Next pap due in 2022.   Risk Assessment    Risk Scores      02/19/2018   Last edited by: Theodore Demark, RN   5-year risk: 1.4 %   Lifetime risk: 7.7 %        Patient has been screened for eligibility.  She does not have any insurance, Medicare or Medicaid.  She also meets financial eligibility.  Hand-out given on the Affordable Care Act.    Plan:     Screening mammogram ordered.  Will follow-up per [facility] protocol.

## 2018-03-30 ENCOUNTER — Other Ambulatory Visit: Payer: Self-pay

## 2018-03-30 ENCOUNTER — Emergency Department
Admission: EM | Admit: 2018-03-30 | Discharge: 2018-03-30 | Disposition: A | Discharge status: Home / Self Care | Payer: Self-pay | Attending: Emergency Medicine | Admitting: Emergency Medicine

## 2018-03-30 DIAGNOSIS — Z79899 Other long term (current) drug therapy: Secondary | ICD-10-CM | POA: Insufficient documentation

## 2018-03-30 DIAGNOSIS — I10 Essential (primary) hypertension: Secondary | ICD-10-CM | POA: Insufficient documentation

## 2018-03-30 DIAGNOSIS — G5712 Meralgia paresthetica, left lower limb: Secondary | ICD-10-CM | POA: Insufficient documentation

## 2018-03-30 HISTORY — DX: Obesity, unspecified: E66.9

## 2018-03-30 NOTE — ED Provider Notes (Signed)
[facility] Emergency Department Provider Note  ____________________________________________   First MD Initiated Contact with Patient 03/30/18 1955     (approximate)  I have reviewed the triage vital signs and the nursing notes.   HISTORY  Chief Complaint Numbness    HPI Leah Stephens is a 57 y.o. female with a history of diabetes, obesity, but no diabetes who presents for evaluation of about 1 week of numbness, tingling, and pain in the lateral side of her left thigh.  Nothing in particular seems to make it better or worse.  She has no weakness in the extremity, just the burning pain which is only present on the lateral aspect of the proximal left thigh.  She was concerned she may be having a " blood supply issue" or problems with her varicose veins.  She says that the pain can be severe at times as well as the numbness.  It does not radiate anywhere else and does not involve any other part of her body.  She denies headache, neck pain, low back pain, chest pain, shortness of breath, nausea, vomiting, and abdominal pain.  She does report wearing tightfitting leggings recently and says that she has lost 10 pounds but it is unclear over what period of time that is the case.  Past Medical History:  Diagnosis Date  . Hypertension   . Obesity     There are no active problems to display for this patient.   Past Surgical History:  Procedure Laterality Date  . CESAREAN SECTION      Prior to Admission medications   Medication Sig Start Date End Date Taking? Authorizing Provider  chlorhexidine (PAROEX) 0.12 % solution Use as directed 15 mLs in the mouth or throat 2 (two) times daily. 05/03/15   Schuyler Amor, MD  clindamycin (CLEOCIN) 300 MG capsule Take 1 capsule (300 mg total) by mouth 3 (three) times daily. 05/03/15   Schuyler Amor, MD  hydrOXYzine (ATARAX/VISTARIL) 50 MG tablet Take 1 tablet (50 mg total) by mouth 3 (three) times daily as needed for  itching. 05/02/15   Sable Feil, PA-C  ibuprofen (ADVIL,MOTRIN) 800 MG tablet Take 1 tablet (800 mg total) by mouth every 8 (eight) hours as needed for moderate pain. 05/02/15   Sable Feil, PA-C  lisinopril (PRINIVIL,ZESTRIL) 20 MG tablet Take 20 mg by mouth daily.    [provider]  oxyCODONE-acetaminophen (ROXICET) 5-325 MG per tablet Take 1 tablet by mouth every 6 (six) hours as needed. 05/03/15   Schuyler Amor, MD  traMADol (ULTRAM) 50 MG tablet Take 1 tablet (50 mg total) by mouth every 6 (six) hours as needed for moderate pain. 05/02/15   Sable Feil, PA-C    Allergies Patient has no known allergies.  Family History  Problem Relation Age of Onset  . Breast cancer Neg Hx     Social History Social History   Tobacco Use  . Smoking status: Never Smoker  . Smokeless tobacco: Never Used  Substance Use Topics  . Alcohol use: Yes  . Drug use: No    Review of Systems Constitutional: No fever/chills Eyes: No visual changes. Cardiovascular: Denies chest pain. Respiratory: Denies shortness of breath. Gastrointestinal: No abdominal pain.  No nausea, no vomiting.  No diarrhea.  No constipation. Genitourinary: Negative for dysuria. Musculoskeletal: Negative for neck pain.  Negative for back pain. Integumentary: Negative for rash. Neurological: Numbness/tingling/pain in left lateral thigh as described above. Negative for headaches and focal  weakness. Psychiatric:No complaints nor concerns about psych issues  ____________________________________________   PHYSICAL EXAM:  VITAL SIGNS: ED Triage Vitals  Enc Vitals Group     BP 03/30/18 1711 139/67     Pulse Rate 03/30/18 1711 71     Resp 03/30/18 1711 14     Temp 03/30/18 1711 98.2 F (36.8 C)     Temp Source 03/30/18 1711 Oral     SpO2 03/30/18 1711 96 %     Weight 03/30/18 1712 98.9 kg (218 lb)     Height 03/30/18 1712 1.499 m (4\' 11" )     Head Circumference --      Peak Flow --      Pain Score  03/30/18 1714 10     Pain Loc --      Pain Edu? --      Excl. in [place]? --     Constitutional: Alert and oriented. Well appearing and in no acute distress. Eyes: Conjunctivae are normal.  Head: Atraumatic. Nose: No congestion/rhinnorhea. Cardiovascular: Normal rate, regular rhythm. Good peripheral circulation. Grossly normal heart sounds. Respiratory: Normal respiratory effort.  No retractions. Lungs CTAB. Gastrointestinal: Soft and nontender. No distention.  Musculoskeletal: No lower extremity tenderness nor edema. No gross deformities of extremities. Neurologic:  Normal speech and language. No gross focal neurologic deficits are appreciated.  Good strength and major muscle groups of arms and legs including good grip strength.  Normal flexion and extension of the left thigh.  His subjective sensory deficit and tenderness to palpation of the left lateral proximal thigh in the distribution of the lateral femoral cutaneous nerve. Skin:  Skin is warm, dry and intact. No rash noted. Psychiatric: Mood and affect are normal. Speech and behavior are normal.  ____________________________________________   LABS (all labs ordered are listed, but only abnormal results are displayed)  Labs Reviewed - No data to display ____________________________________________  EKG  None - EKG not ordered by ED physician ____________________________________________  RADIOLOGY   ED MD interpretation: No indication for imaging  Official radiology report(s): No results found.  ____________________________________________   PROCEDURES  Critical Care performed: No   Procedure(s) performed:   Procedures   ____________________________________________   INITIAL IMPRESSION / ASSESSMENT AND PLAN / ED COURSE  As part of my medical decision making, I reviewed the following data within the [facility] notes reviewed and incorporated    Differential diagnosis includes, but  is not limited to, meralgia paresthetica, other nonspecific radiculopathy or nerve impingement, CVA, peripheral neuropathy, peripheral vascular disease or other vascular impingement.  The distribution of the patient's symptoms are consistent with lateral femoral cutaneous nerve.  She has no weakness or other focal neurological deficits, only the subjective sensory symptoms.  She has no other concerning signs or symptoms in her work-up.  She has normal vital signs.  She has no history of diabetes.  She wears tightfitting leggings which likely are causing the nerve impingement.  I had my usual customary meralgia paresthetica discussion with her including reassurance that this disorder does not represent a serious or emergent condition.  I recommended avoiding tightfitting garments and using ibuprofen 600 mg 3 times a day with meals.  I encouraged follow-up with her outpatient provider but also have been providing the contact information for Dr. Sharlet Salina in case of nerve blocks or other injections and that being needed if this does not end up being a self-limiting condition.  She understands and agrees with the plan.  No indication for further work-up.  ____________________________________________  FINAL CLINICAL IMPRESSION(S) / ED DIAGNOSES  Final diagnoses:  Meralgia paresthetica, left lower limb     MEDICATIONS GIVEN DURING THIS VISIT:  Medications - No data to display   ED Discharge Orders    None       Note:  This document was prepared using Dragon voice recognition software and may include unintentional dictation errors.    Hinda Kehr, MD 03/30/18 2022

## 2018-03-30 NOTE — ED Notes (Signed)
Per Dr. Karma Greaser, no orders at this time.

## 2018-03-30 NOTE — Discharge Instructions (Signed)
You have a condition called meralgia paresthetica, with is impingement on the left lateral femoral cutaneous nerve.  This causes the numbness and tingling and pain that you are feeling in your left lateral thigh.  It is usually a self-limiting condition (it usually goes away on its own) but it can last several weeks to months.  The first step of treatment is to avoid tight fitting close such as tight pants/jeans and/or leggings or tights.  You may want to consider talking about a weight loss regimen with your primary care doctor because that can help relieve the symptoms (although sometimes dramatic weight loss can also lead to symptoms).  You may feel a little bit better with heating pads or cool packs on the affected area.  We encourage you to use 600 mg of ibuprofen 3 times a day with meals for at least the next 5 days to see if this helps as well.  In addition to the ibuprofen (Advil), you can use up to Tylenol 1000 mg every 6 hours as needed for pain.  We recommend that you discuss these symptoms and diagnosis with your primary care doctor.  However, I also provided you with the name and contact information for Dr. Sharlet Salina.  He works with people with persistent nerve pain and other types of chronic pain and may be able to discuss other treatment options with you, such as injections and nerve blocks that may be beneficial.    Return to the emergency department if you develop new or worsening symptoms that concern you.

## 2018-03-30 NOTE — ED Triage Notes (Signed)
Pt arrives to ED c/o L leg numbness. States varicose veins have been growing. States symptoms x 1 week. Alert, oriented, ambulatory. States "it feels swollen." denies DM. Denies blood thinner use.

## 2019-01-16 ENCOUNTER — Telehealth: Payer: Self-pay

## 2019-01-16 NOTE — Telephone Encounter (Signed)
Coronavirus (COVID-19) Are you at risk?  Are you at risk for the Coronavirus (COVID-19)?  To be considered HIGH RISK for Coronavirus (COVID-19), you have to meet the following criteria:  . Traveled to [place], [place], [place], [place] or [place]; or in the [place] to [place], [place], [place], or [place]; and have fever, cough, and shortness of breath within the last 2 weeks of travel OR . Been in close contact with a person diagnosed with COVID-19 within the last 2 weeks and have fever, cough, and shortness of breath . IF YOU DO NOT MEET THESE CRITERIA, YOU ARE CONSIDERED LOW RISK FOR COVID-19.  What to do if you are HIGH RISK for COVID-19?  Marland Kitchen If you are having a medical emergency, call 911. . Seek medical care right away. Before you go to a doctor's office, urgent care or emergency department, call ahead and tell them about your recent travel, contact with someone diagnosed with COVID-19, and your symptoms. You should receive instructions from your physician's office regarding next steps of care.  . When you arrive at healthcare provider, tell the healthcare staff immediately you have returned from visiting [place], [place], [place], [place] or [place]; or traveled in the [place] to [place], [place], [place], or [place]; in the last two weeks or you have been in close contact with a person diagnosed with COVID-19 in the last 2 weeks.   . Tell the health care staff about your symptoms: fever, cough and shortness of breath. . After you have been seen by a medical provider, you will be either: o Tested for (COVID-19) and discharged home on quarantine except to seek medical care if symptoms worsen, and asked to  - Stay home and avoid contact with others until you get your results (4-5 days)  - Avoid travel on public transportation if possible (such as bus, train, or airplane) or o Sent to the Emergency Department by EMS for evaluation, COVID-19 testing, and possible  admission depending on your condition and test results.  What to do if you are LOW RISK for COVID-19?  Reduce your risk of any infection by using the same precautions used for avoiding the common cold or flu:  Marland Kitchen Wash your hands often with soap and warm water for at least 20 seconds.  If soap and water are not readily available, use an alcohol-based hand sanitizer with at least 60% alcohol.  . If coughing or sneezing, cover your mouth and nose by coughing or sneezing into the elbow areas of your shirt or coat, into a tissue or into your sleeve (not your hands). . Avoid shaking hands with others and consider head nods or verbal greetings only. . Avoid touching your eyes, nose, or mouth with unwashed hands.  . Avoid close contact with people who are Leah Stephens. . Avoid places or events with large numbers of people in one location, like concerts or sporting events. . Carefully consider travel plans you have or are making. . If you are planning any travel outside or inside the [place], visit the CDC's Travelers' Health webpage for the latest health notices. . If you have some symptoms but not all symptoms, continue to monitor at home and seek medical attention if your symptoms worsen. . If you are having a medical emergency, call 911.  01/16/19 SCREENING NEG SLS ADDITIONAL HEALTHCARE OPTIONS FOR PATIENTS  [facility] / e-Visit: [email or web address]         [facility]: [phone]  [facility]: [phone]                   [facility]: [phone]  

## 2019-01-19 ENCOUNTER — Ambulatory Visit (INDEPENDENT_AMBULATORY_CARE_PROVIDER_SITE_OTHER): Payer: 59 | Admitting: Certified Nurse Midwife

## 2019-01-19 ENCOUNTER — Other Ambulatory Visit: Payer: Self-pay

## 2019-01-19 ENCOUNTER — Encounter: Payer: Self-pay | Admitting: Certified Nurse Midwife

## 2019-01-19 VITALS — BP 106/69 | HR 72 | Ht 59.0 in | Wt 229.3 lb

## 2019-01-19 DIAGNOSIS — N939 Abnormal uterine and vaginal bleeding, unspecified: Secondary | ICD-10-CM

## 2019-01-19 DIAGNOSIS — N841 Polyp of cervix uteri: Secondary | ICD-10-CM

## 2019-01-19 NOTE — Patient Instructions (Signed)
Abnormal Uterine Bleeding  Abnormal uterine bleeding means bleeding more than usual from your uterus. It can include:   Bleeding between periods.   Bleeding after sex.   Bleeding that is heavier than normal.   Periods that last longer than usual.   Bleeding after you have stopped having your period (menopause).  There are many problems that may cause this. You should see a doctor for any kind of bleeding that is not normal. Treatment depends on the cause of the bleeding.  Follow these instructions at home:   Watch your condition for any changes.   Do not use tampons, douche, or have sex, if your doctor tells you not to.   Change your pads often.   Get regular well-woman exams. Make sure they include a pelvic exam and cervical cancer screening.   Keep all follow-up visits as told by your doctor. This is important.  Contact a doctor if:   The bleeding lasts more than one week.   You feel dizzy at times.   You feel like you are going to throw up (nauseous).   You throw up.  Get help right away if:   You pass out.   You have to change pads every hour.   You have belly (abdominal) pain.   You have a fever.   You get sweaty.   You get weak.   You passing large blood clots from your vagina.  Summary   Abnormal uterine bleeding means bleeding more than usual from your uterus.   There are many problems that may cause this. You should see a doctor for any kind of bleeding that is not normal.   Treatment depends on the cause of the bleeding.  This information is not intended to replace advice given to you by your health care provider. Make sure you discuss any questions you have with your health care provider.  Document Released: 05/27/2009 Document Revised: 07/24/2016 Document Reviewed: 07/24/2016  Elsevier Interactive Patient Education  2019 [employer]vier Inc[employer]

## 2019-01-19 NOTE — Progress Notes (Signed)
Patient comes in today for abnormal uterine bleeding. She has been bleeding off and on for 3 months. She is bleeding lite today.

## 2019-01-19 NOTE — Progress Notes (Signed)
GYN ENCOUNTER NOTE  Subjective:       Leah Stephens is a 58 y.o. G22P0 female is here for gynecologic evaluation of the following issues:  1. Abnormal uterine bleeding  Reports long, frequent bimonthly periods for the last three (3) months. States bleeding with stop for three (3) to four (4) days then started again. Has lasted along as 10 days.   Prefers to see OB/GYN. Agrees to see midwife this visit for work up and follow up with female physician.   Denies difficulty breathing or respiratory distress, chest pain, abdominal pain, dysuria, and leg pain.    Gynecologic History  Patient's last menstrual period was 01/19/2019.  Contraception: unknown  Last Pap: Up to date per patient.   Last mammogram: Up to date per patient.   Obstetric History  OB History  Gravida Para Term Preterm AB Living  4         4  SAB TAB Ectopic Multiple Live Births               # Outcome Date GA Lbr Len/2nd Weight Sex Delivery Anes PTL Lv  4 Gravida           3 Gravida           2 Gravida           1 [place]             Past Medical History:  Diagnosis Date  . Hypertension   . Obesity     Past Surgical History:  Procedure Laterality Date  . CESAREAN SECTION      Current Outpatient Medications on File Prior to Visit  Medication Sig Dispense Refill  . lisinopril (PRINIVIL,ZESTRIL) 20 MG tablet Take 20 mg by mouth daily.     No current facility-administered medications on file prior to visit.     No Known Allergies  Social History   Socioeconomic History  . Marital status: Single    Spouse name: Not on file  . Number of children: Not on file  . Years of education: Not on file  . Highest education level: Not on file  Occupational History  . Not on file  Social Needs  . Financial resource strain: Not on file  . Food insecurity:    Worry: Not on file    Inability: Not on file  . Transportation needs:    Medical: Not on file    Non-medical: Not on file  Tobacco Use  .  Smoking status: Never Smoker  . Smokeless tobacco: Never Used  Substance and Sexual Activity  . Alcohol use: Yes  . Drug use: No  . Sexual activity: Not on file  Lifestyle  . Physical activity:    Days per week: Not on file    Minutes per session: Not on file  . Stress: Not on file  Relationships  . Social connections:    Talks on phone: Not on file    Gets together: Not on file    Attends religious service: Not on file    Active member of club or organization: Not on file    Attends meetings of clubs or organizations: Not on file    Relationship status: Not on file  . Intimate partner violence:    Fear of current or ex partner: Not on file    Emotionally abused: Not on file    Physically abused: Not on file    Forced sexual activity: Not on file  Other Topics Concern  .  Not on file  Social History Narrative  . Not on file    Family History  Problem Relation Age of Onset  . Breast cancer Neg Hx     The following portions of the patient's history were reviewed and updated as appropriate: allergies, current medications, past family history, past medical history, past social history, past surgical history and problem list.  Review of Systems  ROS negative except as noted above. Information obtained from patient.   Objective:   BP 106/69   Pulse 72   Ht 4\' 11"  (1.499 m)   Wt 229 lb 4.8 oz (104 kg)   LMP 01/19/2019   BMI 46.31 kg/m    CONSTITUTIONAL: Well-developed, well-nourished female in no acute distress.   ABDOMEN: Soft, non distended; Non tender. Obese.  PELVIC:  External Genitalia: Normal  Vagina: Normal  Cervix: Polyp present at os  Uterus: Unable to palpate due to habitus  Adnexa: Unable to palpate due to habitus  MUSCULOSKELETAL: Normal range of motion. No tenderness.  No cyanosis, clubbing, or edema.  Assessment:   1. Abnormal uterine bleeding  - CBC - Ferritin - Estradiol - FSH/LH - TSH - US PELVIS TRANSVANGINAL NON-OB (TV ONLY);  Future - US PELVIS (TRANSABDOMINAL ONLY); Future - Pathology ([facility])  2. Polyp at cervical os  - Pathology ([facility])   Plan:   Labs: see orders, MD will review at next visit.   Ultrasound ordered.   Cervical poly removed, see note below. Educational sheet provided.   Reviewed red flag symptoms and when to call.   RTC x 1-2 weeks for ultrasound and results review with Dr. Marcelline Mates or sooner if needed.    Diona Fanti, CNM [facility], [facility] 01/19/19 12:05 PM    A total of 20 minutes were spent face-to-face with the patient during this encounter and over half of that time dealt with counseling and coordination of care.    Cervical Polyp Removal Note  Leah Stephens is a 43 y.o. year old G19P0 African American female who presents for removal of a cervical polyp.   BP 106/69   Pulse 72   Ht 4\' 11"  (1.499 m)   Wt 229 lb 4.8 oz (104 kg)   LMP 01/19/2019   BMI 46.31 kg/m   Time out was performed.  A small plastic speculum was placed in the vagina.  The cervix was visualized, and the polyp was visible in ths os. The polyp was grasped and easily removed intact. Will send to pathology for further evaluation.   Reviewed red flag symptoms and when to call.   Teaching sheet given.   RTC as previously scheduled or sooner if needed.    Diona Fanti, CNM [facility], [facility] 01/19/19 12:09 PM

## 2019-01-20 LAB — CBC
Hematocrit: 44.3 % (ref 34.0–46.6)
Hemoglobin: 14.5 g/dL (ref 11.1–15.9)
MCH: 28.6 pg (ref 26.6–33.0)
MCHC: 32.7 g/dL (ref 31.5–35.7)
MCV: 87 fL (ref 79–97)
Platelets: 277 10*3/uL (ref 150–450)
RBC: 5.07 x10E6/uL (ref 3.77–5.28)
RDW: 13.7 % (ref 11.7–15.4)
WBC: 4.6 10*3/uL (ref 3.4–10.8)

## 2019-01-20 LAB — ESTRADIOL: Estradiol: 36.3 pg/mL

## 2019-01-20 LAB — FSH/LH
FSH: 36.5 m[IU]/mL
LH: 27.2 m[IU]/mL

## 2019-01-20 LAB — FERRITIN: Ferritin: 104 ng/mL (ref 15–150)

## 2019-01-20 LAB — TSH: TSH: 1.04 u[IU]/mL (ref 0.450–4.500)

## 2019-01-20 NOTE — Progress Notes (Signed)
Patient has follow up appointment with you to discuss lab and ultrasound results. JML

## 2019-01-20 NOTE — Progress Notes (Signed)
Ok. Thanks!

## 2019-01-22 LAB — ANATOMIC PATHOLOGY REPORT: PDF Image: 0

## 2019-01-23 NOTE — Progress Notes (Signed)
Polyp results. Upcoming visit with MD 06/23 after ultrasound. JML

## 2019-02-03 ENCOUNTER — Other Ambulatory Visit: Payer: Self-pay

## 2019-02-03 ENCOUNTER — Other Ambulatory Visit (HOSPITAL_COMMUNITY)
Admission: RE | Admit: 2019-02-03 | Discharge: 2019-02-03 | Disposition: A | Discharge status: Home / Self Care | Payer: Self-pay | Source: Ambulatory Visit | Attending: Obstetrics and Gynecology | Admitting: Obstetrics and Gynecology

## 2019-02-03 ENCOUNTER — Encounter: Payer: Self-pay | Admitting: Obstetrics and Gynecology

## 2019-02-03 ENCOUNTER — Ambulatory Visit: Payer: 59 | Admitting: Obstetrics and Gynecology

## 2019-02-03 ENCOUNTER — Ambulatory Visit (INDEPENDENT_AMBULATORY_CARE_PROVIDER_SITE_OTHER): Payer: 59

## 2019-02-03 VITALS — BP 122/90 | HR 76 | Ht 59.0 in | Wt 230.3 lb

## 2019-02-03 DIAGNOSIS — Z6841 Body Mass Index (BMI) 40.0 and over, adult: Secondary | ICD-10-CM

## 2019-02-03 DIAGNOSIS — N939 Abnormal uterine and vaginal bleeding, unspecified: Secondary | ICD-10-CM

## 2019-02-03 DIAGNOSIS — N924 Excessive bleeding in the premenopausal period: Secondary | ICD-10-CM

## 2019-02-03 DIAGNOSIS — D250 Submucous leiomyoma of uterus: Secondary | ICD-10-CM

## 2019-02-03 DIAGNOSIS — Z9889 Other specified postprocedural states: Secondary | ICD-10-CM

## 2019-02-03 DIAGNOSIS — D252 Subserosal leiomyoma of uterus: Secondary | ICD-10-CM

## 2019-02-03 DIAGNOSIS — Z8742 Personal history of other diseases of the female genital tract: Secondary | ICD-10-CM

## 2019-02-03 NOTE — Patient Instructions (Signed)
Postmenopausal Bleeding  Postmenopausal bleeding is any bleeding that a woman has after she has entered into menopause. Menopause is the end of a womans fertile years. After menopause, a woman no longer ovulates and does not have menstrual periods. Postmenopausal bleeding may have various causes, including:  Menopausal hormone therapy (MHT).  Endometrial atrophy. After menopause, low estrogen hormone levels cause the membrane that lines the uterus (endometrium) to become thinner. You may have bleeding as the endometrium thins.  Endometrial hyperplasia. This condition is caused by excess estrogen hormones and low levels of progesterone hormones. The excess estrogen causes the endometrium to thicken, which can lead to bleeding. In some cases, this can lead to cancer of the uterus.  Endometrial cancer.  Non-cancerous growths (polyps) on the endometrium, the lining of the uterus, or the cervix.  Uterine fibroids. These are non-cancerous growths in or around the uterus muscle tissue that can cause heavy bleeding. Any type of postmenopausal bleeding, even if it appears to be a typical menstrual period, should be evaluated by your health care provider. Treatment will depend on the cause of the bleeding. Follow these instructions at home:  Pay attention to any changes in your symptoms.  Avoid using tampons and douches as told by your health care provider.  Change your pads regularly.  Get regular pelvic exams and Pap tests.  Take iron supplements as told by your health care provider.  Take over-the-counter and prescription medicines only as told by your health care provider.  Keep all follow-up visits as told by your health care provider. This is important. Contact a health care provider if:  Your bleeding lasts more than 1 week.  You have abdominal pain.  You have bleeding with or after sexual intercourse.  You have bleeding that happens more often than every 3 weeks. Get help  right away if:  You have a fever, chills, headache, dizziness, muscle aches, and bleeding.  You have severe pain with bleeding.  You are passing blood clots.  You have heavy bleeding, need more than 1 pad an hour, and have never experienced this before.  You feel faint. Summary  Postmenopausal bleeding is any bleeding that a woman has after she has entered into menopause.  Postmenopausal bleeding may have various causes. Treatment will depend on the cause of the bleeding.  Any type of postmenopausal bleeding, even if it appears to be a typical menstrual period, should be evaluated by your health care provider.  Be sure to pay attention to any changes in your symptoms and keep all follow-up visits as told by your health care provider. This information is not intended to replace advice given to you by your health care provider. Make sure you discuss any questions you have with your health care provider. Document Released: 11/07/2005 Document Revised: 10/23/2016 Document Reviewed: 10/23/2016 Elsevier Interactive Patient Education  2019 [employer].      Endometrial Ablation Endometrial ablation is a procedure that destroys the thin inner layer of the lining of the uterus (endometrium). This procedure may be done:  To stop heavy periods.  To stop bleeding that is causing anemia.  To control irregular bleeding.  To treat bleeding caused by small tumors (fibroids) in the endometrium. This procedure is often an alternative to major surgery, such as removal of the uterus and cervix (hysterectomy). As a result of this procedure:  You may not be able to have children. However, if you are premenopausal (you have not gone through menopause): ? You may still have a  small chance of getting pregnant. ? You will need to use a reliable method of birth control after the procedure to prevent pregnancy.  You may stop having a menstrual period, or you may have only a small amount of bleeding  during your period. Menstruation may return several years after the procedure. Tell a health care provider about:  Any allergies you have.  All medicines you are taking, including vitamins, herbs, eye drops, creams, and over-the-counter medicines.  Any problems you or family members have had with the use of anesthetic medicines.  Any blood disorders you have.  Any surgeries you have had.  Any medical conditions you have. What are the risks? Generally, this is a safe procedure. However, problems may occur, including:  A hole (perforation) in the uterus or bowel.  Infection of the uterus, bladder, or vagina.  Bleeding.  Damage to other structures or organs.  An air bubble in the lung (air embolus).  Problems with pregnancy after the procedure.  Failure of the procedure.  Decreased ability to diagnose cancer in the endometrium. What happens before the procedure?  You will have tests of your endometrium to make sure there are no pre-cancerous cells or cancer cells present.  You may have an ultrasound of the uterus.  You may be given medicines to thin the endometrium.  Ask your health care provider about: ? Changing or stopping your regular medicines. This is especially important if you take diabetes medicines or blood thinners. ? Taking medicines such as aspirin and ibuprofen. These medicines can thin your blood. Do not take these medicines before your procedure if your doctor tells you not to.  Plan to have someone take you home from the hospital or clinic. What happens during the procedure?   You will lie on an exam table with your feet and legs supported as in a pelvic exam.  To lower your risk of infection: ? Your health care team will wash or sanitize their hands and put on germ-free (sterile) gloves. ? Your genital area will be washed with soap.  An IV tube will be inserted into one of your veins.  You will be given a medicine to help you relax  (sedative).  A surgical instrument with a light and camera (resectoscope) will be inserted into your vagina and moved into your uterus. This allows your surgeon to see inside your uterus.  Endometrial tissue will be removed using one of the following methods: ? Radiofrequency. This method uses a radiofrequency-alternating electric current to remove the endometrium. ? Cryotherapy. This method uses extreme cold to freeze the endometrium. ? Heated-free liquid. This method uses a heated saltwater (saline) solution to remove the endometrium. ? Microwave. This method uses high-energy microwaves to heat up the endometrium and remove it. ? Thermal balloon. This method involves inserting a catheter with a balloon tip into the uterus. The balloon tip is filled with heated fluid to remove the endometrium. The procedure may vary among health care providers and hospitals. What happens after the procedure?  Your blood pressure, heart rate, breathing rate, and blood oxygen level will be monitored until the medicines you were given have worn off.  As tissue healing occurs, you may notice vaginal bleeding for 4-6 weeks after the procedure. You may also experience: ? Cramps. ? Thin, watery vaginal discharge that is light pink or brown in color. ? A need to urinate more frequently than usual. ? Nausea.  Do not drive for 24 hours if you were given a sedative.  Do not have sex or insert anything into your vagina until your health care provider approves. Summary  Endometrial ablation is done to treat the many causes of heavy menstrual bleeding.  The procedure may be done only after medications have been tried to control the bleeding.  Plan to have someone take you home from the hospital or clinic. This information is not intended to replace advice given to you by your health care provider. Make sure you discuss any questions you have with your health care provider. Document Released: 06/08/2004 Document  Revised: 01/14/2018 Document Reviewed: 08/16/2016 Elsevier Interactive Patient Education  2019 [employer].   Levonorgestrel intrauterine device (IUD) What is this medicine? LEVONORGESTREL IUD (LEE voe nor jes trel) is a contraceptive (birth control) device. The device is placed inside the uterus by a healthcare professional. It is used to prevent pregnancy. This device can also be used to treat heavy bleeding that occurs during your period. This medicine may be used for other purposes; ask your health care provider or pharmacist if you have questions. COMMON BRAND NAME(S): Minette Headland What should I tell my health care provider before I take this medicine? They need to know if you have any of these conditions: -abnormal Pap smear -cancer of the breast, uterus, or cervix -diabetes -endometritis -genital or pelvic infection now or in the past -have more than one sexual partner or your partner has more than one partner -heart disease -history of an ectopic or tubal pregnancy -immune system problems -IUD in place -liver disease or tumor -problems with blood clots or take blood-thinners -seizures -use intravenous drugs -uterus of unusual shape -vaginal bleeding that has not been explained -an unusual or allergic reaction to levonorgestrel, other hormones, silicone, or polyethylene, medicines, foods, dyes, or preservatives -pregnant or trying to get pregnant -breast-feeding How should I use this medicine? This device is placed inside the uterus by a health care professional. Talk to your pediatrician regarding the use of this medicine in children. Special care may be needed. Overdosage: If you think you have taken too much of this medicine contact a poison control center or emergency room at once. NOTE: This medicine is only for you. Do not share this medicine with others. What if I miss a dose? This does not apply. Depending on the brand of device you have  inserted, the device will need to be replaced every 3 to 5 years if you wish to continue using this type of birth control. What may interact with this medicine? Do not take this medicine with any of the following medications: -amprenavir -bosentan -fosamprenavir This medicine may also interact with the following medications: -aprepitant -armodafinil -barbiturate medicines for inducing sleep or treating seizures -bexarotene -boceprevir -griseofulvin -medicines to treat seizures like carbamazepine, ethotoin, felbamate, oxcarbazepine, phenytoin, topiramate -modafinil -pioglitazone -rifabutin -rifampin -rifapentine -some medicines to treat HIV infection like atazanavir, efavirenz, indinavir, lopinavir, nelfinavir, tipranavir, ritonavir -St. John's wort -warfarin This list may not describe all possible interactions. Give your health care provider a list of all the medicines, herbs, non-prescription drugs, or dietary supplements you use. Also tell them if you smoke, drink alcohol, or use illegal drugs. Some items may interact with your medicine. What should I watch for while using this medicine? Visit your doctor or health care professional for regular check ups. See your doctor if you or your partner has sexual contact with others, becomes HIV positive, or gets a sexual transmitted disease. This product does not protect you against HIV infection (AIDS)  or other sexually transmitted diseases. You can check the placement of the IUD yourself by reaching up to the top of your vagina with clean fingers to feel the threads. Do not pull on the threads. It is a good habit to check placement after each menstrual period. Call your doctor right away if you feel more of the IUD than just the threads or if you cannot feel the threads at all. The IUD may come out by itself. You may become pregnant if the device comes out. If you notice that the IUD has come out use a backup birth control method like condoms  and call your health care provider. Using tampons will not change the position of the IUD and are okay to use during your period. This IUD can be safely scanned with magnetic resonance imaging (MRI) only under specific conditions. Before you have an MRI, tell your healthcare provider that you have an IUD in place, and which type of IUD you have in place. What side effects may I notice from receiving this medicine? Side effects that you should report to your doctor or health care professional as soon as possible: -allergic reactions like skin rash, itching or hives, swelling of the face, lips, or tongue -fever, flu-like symptoms -genital sores -high blood pressure -no menstrual period for 6 weeks during use -pain, swelling, warmth in the leg -pelvic pain or tenderness -severe or sudden headache -signs of pregnancy -stomach cramping -sudden shortness of breath -trouble with balance, talking, or walking -unusual vaginal bleeding, discharge -yellowing of the eyes or skin Side effects that usually do not require medical attention (report to your doctor or health care professional if they continue or are bothersome): -acne -breast pain -change in sex drive or performance -changes in weight -cramping, dizziness, or faintness while the device is being inserted -headache -irregular menstrual bleeding within first 3 to 6 months of use -nausea This list may not describe all possible side effects. Call your doctor for medical advice about side effects. You may report side effects to FDA at 1-800-FDA-1088. Where should I keep my medicine? This does not apply. NOTE: This sheet is a summary. It may not cover all possible information. If you have questions about this medicine, talk to your doctor, pharmacist, or health care provider.  2019 Elsevier/Gold Standard (2016-05-11 14:14:56)

## 2019-02-03 NOTE — Addendum Note (Signed)
Addended by: Edwyna Shell on: 02/03/2019 02:01 PM   Modules accepted: Orders

## 2019-02-03 NOTE — Progress Notes (Signed)
GYNECOLOGY PROGRESS NOTE  Subjective:    Patient ID: Leah Stephens, female    DOB: 05-06-1961, 58 y.o.   MRN: [id number]  HPI  Patient is a 58 y.o. [id number] female who presents for follow up of complaints of abnormal uterine bleeding. She has a h/o fibroid uterus. Her bleeding has been abnormal for the past 3 months, however upon further discussion, she has had very heavy cycles over the past 2-3 years. Was worked up at that time with endometrial biopsy and ultrasound which is when she was diagnosed with her fibroids.  In the past 3 months she has also had bimonthly cycles, lasting 3-4 days (however sometimes can last up to 10 days.  She was last seen by Dani Gobble, CNM who upon initial evaluation was noted to have a cervical polyp which was removed. She also had labs performed and an ultrasound performed for which she presents for results today.    GYN History:  Pap smear: [facility], 2018. Results were normal.  Mammogram: 02/19/2018. Results were normal.     The following portions of the patient's history were reviewed and updated as appropriate: allergies, current medications, past family history, past medical history, past social history, past surgical history and problem list.   Review of Systems Pertinent items noted in HPI and remainder of comprehensive ROS otherwise negative.   Objective:   Blood pressure 122/90, pulse 76, height 4\' 11"  (1.499 m), weight 230 lb 4.8 oz (104.5 kg), last menstrual period 01/19/2019. Body mass index is 46.51 kg/m.  General appearance: alert, cooperative and no distress, morbidly obese Abdomen: soft, non-tender; bowel sounds normal; no masses,  no organomegaly Pelvic: external genitalia normal, rectovaginal septum normal.  Vagina without discharge.  Cervix normal appearing, no lesions and no motion tenderness.  Uterus difficult to palpate due to body habitus but no tenderness noted, feels normal size.   Adnexae non-palpable, nontender  bilaterally.  Extremities: extremities normal, atraumatic, no cyanosis or edema Neurologic: Grossly normal     Labs:  Office Visit on 01/19/2019  Component Date Value Ref Range Status  . WBC 01/19/2019 4.6  3.4 - 10.8 x10E3/uL Final  . RBC 01/19/2019 5.07  3.77 - 5.28 x10E6/uL Final  . Hemoglobin 01/19/2019 14.5  11.1 - 15.9 g/dL Final  . Hematocrit 01/19/2019 44.3  34.0 - 46.6 % Final  . MCV 01/19/2019 87  79 - 97 fL Final  . MCH 01/19/2019 28.6  26.6 - 33.0 pg Final  . MCHC 01/19/2019 32.7  31.5 - 35.7 g/dL Final  . RDW 01/19/2019 13.7  11.7 - 15.4 % Final  . Platelets 01/19/2019 277  150 - 450 x10E3/uL Final  . Ferritin 01/19/2019 104  15 - 150 ng/mL Final  . Estradiol 01/19/2019 36.3  pg/mL Final   Comment:                     Adult Female:                       Follicular phase   01.7 -   166.0                       Ovulation phase    85.8 -   498.0                       Luteal phase       43.8 -   211.0  Postmenopausal     <6.0 -    54.7                     Pregnancy                       1st trimester     215.0 - >4300.0                     Girls (1-10 years)    6.0 -    27.0 Roche ECLIA methodology   . LH 01/19/2019 27.2  mIU/mL Final   Comment:                     Adult Female:                       Follicular phase      2.4 -  12.6                       Ovulation phase      14.0 -  95.6                       Luteal phase          1.0 -  11.4                       Postmenopausal        7.7 -  58.5   . [facility] 01/19/2019 36.5  mIU/mL Final   Comment:                     Adult Female:                       Follicular phase      3.5 -  12.5                       Ovulation phase       4.7 -  21.5                       Luteal phase          1.7 -   7.7                       Postmenopausal       25.8 - 134.8   . TSH 01/19/2019 1.040  0.450 - 4.500 uIU/mL Final  . Diagnosis synopsis: 01/19/2019 Comment   Final   Specimen A-Cervical Polyp(s), cervix:  BENIGN ENDOMETRIAL  . Specimen: 01/19/2019 Comment   Final   Specimen A-Cervical Polyp(s), cervix  . Diagnosis: 01/19/2019 Comment   Final   Specimen A-BENIGN ENDOMETRIAL POLYP.  Johney Maine description: 01/19/2019 Comment   Final   Comment: The specimen is received in formalin labeled "Hancox, Julaine, cervical polyp. Received is a 2.5 x 1.4 x 0.8 cm pink-tan polypoid portion of soft tissue. The apparent resection margin is inked blue. The specimen is bisected and submitted entirely in cassette 1.   . Electronically signed by: 01/19/2019 Comment   Final   Gretta Arab, MD, Pathologist  . CPT code(s): 01/19/2019 Comment   Final   Specimen [id number]  . CPT Disclaimer: 01/19/2019 Comment   Final   Comment: CPT codes, as published by  the AMA, are provided for informational purpose only as the assignment of the CPT codes is the responsibility of the billing party.   . Clinician provided ICD: 01/19/2019 Comment   Final   N93.9, N84.1  . Pathologist provided ICD: 01/19/2019 N84.0   Final  . PDF Image 01/19/2019 .   Final     Imaging:  Patient Name: Leah Stephens DOB: 06-10-1961 MRN: [id number] ULTRASOUND REPORT  Location: [facility]  Date of Service: 02/03/2019     Indications:Irregular Bleeding. Findings:  The uterus is anteverted and measures 9.6 x 5.2 x 5.4 cm. Echo texture is heterogenous with evidence of focal masses. Within the uterus are multiple suspected fibroids measuring: Fibroid 1:Anterior Subserosal 1.5 x 1.8 x 1.7 cm. Fibroid 2:Posterior Submucosal 1.4 x 1.3 x 1.3 cm.  The Endometrium measures 15 mm.  Right Ovary measures 2.7 x 2.4 x 2.1 cm. It is normal in appearance. Left Ovary measures 3.2 x 2.3 x 2.5 cm. It is normal in appearance. Survey of the adnexa demonstrates no adnexal masses. There is no free fluid in the cul de sac.  Impression: 1. Multiple Fibroids seen as described above. 2. Endometrium is in upper limites measuring 15 mm.   Recommendations: 1.Clinical correlation with the patient's History and Physical Exam.   Jenine M. Albertine Grates    RDMS   Assessment:   Abnormal perimenopausal bleeding  Submucous and subserous leiomyoma of uterus History of cervical polypectomy  Morbid obesity with BMI of 45.0-49.9, adult [facility])  Plan:   - Patient has abnormal uterine bleeding . She has a normal exam, no evidence of lesions.  Labs overall normal, although are at menopausal levels, however patient has not had a 1 year period of amenorrhea.  Likely still within perimenopausal phase. Endometrial lining thickened.  In light of bleeding, obesity, and recent h/o cervical polyp, will perform EMB.  - Discussed management options for abnormal uterine bleeding including oral progesterone,  Levonogestrel IUD, endometrial ablation or hysterectomy as definitive surgical management.  Discussed risks and benefits of each method.   Patient desires currently unsure of desires.  Printed patient education handouts were given to the patient to review at home.  Will wait until biopsy results are back to make a decision.  Will notify patient of results by phone.     Endometrial Biopsy Procedure Note  The patient is positioned on the exam table in the dorsal lithotomy position. Bimanual exam confirms uterine position and size. A Graves speculum is placed into the vagina. A single toothed tenaculum is placed onto the anterior lip of the cervix. The pipette is placed into the endocervical canal and is advanced to the uterine fundus. Using a piston like technique, with vacuum created by withdrawing the stylus, the endometrial specimen is obtained and transferred to the biopsy container. Minimal bleeding is encountered. The procedure is well tolerated.   Uterine Position:  Mid    Uterine Length: 8 cm   Uterine Specimen: Average   Post procedure instructions are given. The patient is scheduled for follow up appointment.       Rubie Maid, MD  [facility]

## 2019-02-03 NOTE — Progress Notes (Signed)
Pt is present today for a follow up appointment following an u/s. Pt stated that she is doing well no complaints.

## 2019-02-12 ENCOUNTER — Telehealth: Payer: Self-pay | Admitting: Obstetrics and Gynecology

## 2019-02-12 NOTE — Telephone Encounter (Signed)
Please see result notes.  

## 2019-02-12 NOTE — Telephone Encounter (Signed)
The patient called requested a call back to check the status of her results she never heard any thing back on. Please advise.

## 2019-02-24 ENCOUNTER — Encounter: Payer: 59 | Admitting: Obstetrics and Gynecology

## 2020-09-23 ENCOUNTER — Other Ambulatory Visit: Payer: Self-pay | Admitting: Family Medicine

## 2020-09-23 DIAGNOSIS — Z1231 Encounter for screening mammogram for malignant neoplasm of breast: Secondary | ICD-10-CM

## 2020-10-19 ENCOUNTER — Ambulatory Visit
Admission: RE | Admit: 2020-10-19 | Discharge: 2020-10-19 | Disposition: A | Discharge status: Home / Self Care | Payer: Self-pay | Source: Ambulatory Visit | Attending: Oncology | Admitting: Oncology

## 2020-10-19 ENCOUNTER — Ambulatory Visit: Payer: Self-pay | Attending: Oncology | Admitting: *Deleted

## 2020-10-19 ENCOUNTER — Encounter: Payer: Self-pay | Admitting: *Deleted

## 2020-10-19 ENCOUNTER — Other Ambulatory Visit: Payer: Self-pay

## 2020-10-19 VITALS — BP 114/53 | HR 71 | Temp 97.9°F | Ht 60.24 in | Wt 236.5 lb

## 2020-10-19 DIAGNOSIS — Z0000 Encounter for general adult medical examination without abnormal findings: Secondary | ICD-10-CM | POA: Insufficient documentation

## 2020-10-19 NOTE — Patient Instructions (Signed)
Gave patient hand-out, Women Staying Healthy, Active and Well from [facility], with education on breast health, pap smears, heart and colon health. 

## 2020-10-19 NOTE — Progress Notes (Signed)
Subjective:     Patient ID: Leah Stephens, female   DOB: 05-Dec-1960, 60 y.o.   MRN: [id number]  HPI   [facility] Medical History Record - 10/19/20 1013      Breast History   Screening cycle New    Provider ([facility]) [facility]    Initial Mammogram 10/19/20    Last Mammogram Annual    Last Mammogram Date 02/19/18    Provider (Mammogram)  [facility]    Recent Breast Symptoms None      Breast Cancer History   Breast Cancer History No personal or family history      Previous History of Breast Problems   Breast Surgery or Biopsy None    Breast Implants N/A    BSE Done Monthly      Gynecological/Obstetrical History   LMP 09/20/20    Is there any chance that the client could be pregnant?  No    Age at menarche 62    Age at menopause n/a    PAP smear history Annually    Date of last PAP  12/16/15    Provider (PAP) [facility]    Age at first live birth 23    Breast fed children Yes (type length in comments)   2 months   DES Exposure Unkown    Cervical, Uterine or Ovarian cancer No    Family history of Cervial, Uterine or Ovarian cancer No    Hysterectomy No    Cervix removed No    Ovaries removed No    Laser/Cryosurgery No    Current method of birth control None    Current method of Estrogen/Hormone replacement None    Smoking history None            Review of Systems     Objective:   Physical Exam Chest:  Breasts:     Right: No swelling, bleeding, inverted nipple, mass, nipple discharge, skin change, tenderness, axillary adenopathy or supraclavicular adenopathy.     Left: No swelling, bleeding, inverted nipple, mass, nipple discharge, skin change, tenderness, axillary adenopathy or supraclavicular adenopathy.    Abdominal:     Palpations: There is no hepatomegaly or splenomegaly.     Hernia: There is no hernia in the left inguinal area or right inguinal area.  Genitourinary:    Exam position: Lithotomy position.     Labia:        Right: No rash, tenderness,  lesion or injury.        Left: No rash, tenderness, lesion or injury.      Urethra: No prolapse or urethral pain.     Vagina: No signs of injury and foreign body. No vaginal discharge, erythema, tenderness, bleeding, lesions or prolapsed vaginal walls.     Cervix: No cervical motion tenderness, discharge, friability, erythema or eversion.     Uterus: Not deviated and not enlarged.      Adnexa:        Right: No mass.         Left: No mass.       Lymphadenopathy:     Upper Body:     Right upper body: No supraclavicular or axillary adenopathy.     Left upper body: No supraclavicular or axillary adenopathy.  Skin:            Assessment:     60 year old female returns to [facility] for clinical breast exam, pap and mammogram.  Clinical breast exam unremarkable.  Taught self breast awareness.  Specimen  for pap collected without difficulty.  Patient states she has an appointment at [facility] for evaluation of heavy menstrual cycles.  Patient had a benign endometrial biopsy at [facility] in 2017.  She has a very black irregular mole like lesion on the left anterior leg.  Patient states "I've been messing with this mole". Encouraged to seen her PCP about the black mole for further evaluation.  Patient has been screened for eligibility.  She does not have any insurance, Medicare or Medicaid.  She also meets financial eligibility.   Risk Assessment    Risk Scores      10/19/2020 02/19/2018   Last edited by: Theodore Demark, RN Theodore Demark, RN   5-year risk: 1.4 % 1.4 %   Lifetime risk: 7.1 % 7.7 %            Plan:     Screening mammogram ordered.  Specimen for pap sent to the lab.  Will follow up per [facility] protocol.

## 2020-10-22 LAB — IGP, APTIMA HPV: HPV Aptima: NEGATIVE

## 2020-10-27 ENCOUNTER — Encounter: Payer: Self-pay | Admitting: *Deleted

## 2020-10-27 NOTE — Progress Notes (Signed)
Letter mailed to inform patient of her normal mammogram and pap smear.  She is to follow up with next mammogram in 1 year and next pap in 5 years.

## 2021-02-17 ENCOUNTER — Ambulatory Visit
Admission: RE | Admit: 2021-02-17 | Discharge: 2021-02-17 | Disposition: A | Discharge status: Home / Self Care | Payer: Disability Insurance | Attending: Dentistry | Admitting: Dentistry

## 2021-02-17 ENCOUNTER — Other Ambulatory Visit: Payer: Self-pay | Admitting: Dentistry

## 2021-02-17 ENCOUNTER — Ambulatory Visit
Admission: RE | Admit: 2021-02-17 | Discharge: 2021-02-17 | Disposition: A | Discharge status: Home / Self Care | Payer: Disability Insurance | Source: Ambulatory Visit | Attending: Dentistry | Admitting: Dentistry

## 2021-02-17 DIAGNOSIS — M1990 Unspecified osteoarthritis, unspecified site: Secondary | ICD-10-CM | POA: Diagnosis present

## 2021-06-06 ENCOUNTER — Encounter: Payer: Self-pay | Admitting: Obstetrics and Gynecology

## 2021-06-16 ENCOUNTER — Encounter: Payer: Self-pay | Admitting: Emergency Medicine

## 2021-06-16 ENCOUNTER — Other Ambulatory Visit: Payer: Self-pay

## 2021-06-16 ENCOUNTER — Emergency Department
Admission: EM | Admit: 2021-06-16 | Discharge: 2021-06-16 | Disposition: A | Discharge status: Home / Self Care | Payer: Medicaid Other | Attending: Emergency Medicine | Admitting: Emergency Medicine

## 2021-06-16 DIAGNOSIS — N939 Abnormal uterine and vaginal bleeding, unspecified: Secondary | ICD-10-CM | POA: Insufficient documentation

## 2021-06-16 DIAGNOSIS — I10 Essential (primary) hypertension: Secondary | ICD-10-CM | POA: Diagnosis not present

## 2021-06-16 DIAGNOSIS — Z79899 Other long term (current) drug therapy: Secondary | ICD-10-CM | POA: Insufficient documentation

## 2021-06-16 LAB — CBC
HCT: 42.1 % (ref 36.0–46.0)
Hemoglobin: 13.7 g/dL (ref 12.0–15.0)
MCH: 28.3 pg (ref 26.0–34.0)
MCHC: 32.5 g/dL (ref 30.0–36.0)
MCV: 87 fL (ref 80.0–100.0)
Platelets: 300 10*3/uL (ref 150–400)
RBC: 4.84 MIL/uL (ref 3.87–5.11)
RDW: 14.6 % (ref 11.5–15.5)
WBC: 6 10*3/uL (ref 4.0–10.5)
nRBC: 0 % (ref 0.0–0.2)

## 2021-06-16 LAB — BASIC METABOLIC PANEL
Anion gap: 7 (ref 5–15)
BUN: 16 mg/dL (ref 6–20)
CO2: 25 mmol/L (ref 22–32)
Calcium: 9.2 mg/dL (ref 8.9–10.3)
Chloride: 108 mmol/L (ref 98–111)
Creatinine, Ser: 0.98 mg/dL (ref 0.44–1.00)
GFR, Estimated: >60 mL/min (ref >60)
Glucose, Bld: 103 mg/dL — ABNORMAL HIGH (ref 70–99)
Potassium: 3.9 mmol/L (ref 3.5–5.1)
Sodium: 140 mmol/L (ref 135–145)

## 2021-06-16 MED ORDER — NORETHINDRONE ACETATE 5 MG PO TABS: ORAL_TABLET | ORAL | 0 refills | Status: DC

## 2021-06-16 NOTE — ED Notes (Signed)
All questions answered, follow up pcp info provided

## 2021-06-16 NOTE — ED Triage Notes (Signed)
Pt comes into the ED via POV c/o vaginal bleeding that started last night.  Pt explains she is postmenopausal and she had some spotting last week so they put her on a medicine and she completed that medicine on Saturday. Then Monday she started having heavy bleeding.  Pt was seen at the clinic yesterday and they told her that sometimes the medicine will cause the bleeding, so they prescribed some birth control pills as well.  Pt states since midnight last night she has changed her pad 7 times (1 per hour).  Pt also  c/o right arm swelling.  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

## 2021-06-16 NOTE — ED Notes (Signed)
Assisted Dr. Cherylann Banas for pelvic exam.

## 2021-06-16 NOTE — ED Provider Notes (Signed)
[facility] Emergency Department Provider Note ____________________________________________   Event Date/Time   First MD Initiated Contact with Patient 06/16/21 1040     (approximate)  I have reviewed the triage vital signs and the nursing notes.   HISTORY  Chief Complaint Vaginal Bleeding and Arm Swelling    HPI Leah Stephens is a 60 y.o. female with PMH as noted below who presents with vaginal bleeding over the last several days, described as heavy and with bright red blood.  The patient states that between midnight and 7 AM she had to change her pad almost every hour.  The patient reports chronic issues with vaginal bleeding intermittently.  She had an endometrial biopsy and an ultrasound in the past.  She states that last month she was started on a 10-day course of Depo-Provera and during that time the bleeding subsided.  She states that a day or 2 after she finished that the bleeding came back and was worse.  She was seen at the outpatient clinic yesterday and started on norethindrone.  She has only taken 1 dose so far but states that the bleeding has persisted.  She denies any abdominal or pelvic pain.  She states that she does feel some generalized weakness but no lightheadedness.  In addition, the patient reports pain to her right forearm and hand that started when she awoke this morning.  She has had pain like this previously and believes it may be due to having slept in a strange position.  She is able to move the arm without difficulty and denies any trauma.  She has no numbness or weakness.    Past Medical History:  Diagnosis Date   Hypertension    Obesity     There are no problems to display for this patient.   Past Surgical History:  Procedure Laterality Date   CESAREAN SECTION      Prior to Admission medications   Medication Sig Start Date End Date Taking? Authorizing Provider  norethindrone (AYGESTIN) 5 MG tablet Take 1 tablet (5 mg  total) by mouth in the morning, at noon, and at bedtime for 4 days, THEN 1 tablet (5 mg total) in the morning and at bedtime for 10 days. 06/16/21 06/30/21 Yes Arta Silence, MD  lisinopril (PRINIVIL,ZESTRIL) 20 MG tablet Take 20 mg by mouth daily.    [provider]    Allergies Patient has no known allergies.  Family History  Problem Relation Age of Onset   Hypertension Mother    Arthritis Mother    Hypertension Father    Arthritis Father    Diabetes Father    Breast cancer Neg Hx     Social History Social History   Tobacco Use   Smoking status: Never   Smokeless tobacco: Never  Vaping Use   Vaping Use: Never used  Substance Use Topics   Alcohol use: Not Currently   Drug use: No    Review of Systems  Constitutional: No fever/chills Eyes: No visual changes. ENT: No sore throat. Cardiovascular: Denies chest pain. Respiratory: Denies shortness of breath. Gastrointestinal: No vomiting or diarrhea.  Genitourinary: Negative for dysuria.  Positive for vaginal bleeding. Musculoskeletal: Negative for back pain.  Positive for right arm pain. Skin: Negative for rash. Neurological: Negative for headaches, focal weakness or numbness.   ____________________________________________   PHYSICAL EXAM:  VITAL SIGNS: ED Triage Vitals [06/16/21 0825]  Enc Vitals Group     BP 117/72     Pulse Rate 80  Resp 18     Temp 98.2 F (36.8 C)     Temp Source Oral     SpO2 97 %     Weight 236 lb 8.9 oz (107.3 kg)     Height 5' (1.524 m)     Head Circumference      Peak Flow      Pain Score 8     Pain Loc      Pain Edu?      Excl. in [place]?     Constitutional: Alert and oriented. Well appearing and in no acute distress. Eyes: Conjunctivae are normal.  Head: Atraumatic. Nose: No congestion/rhinnorhea. Mouth/Throat: Mucous membranes are moist.   Neck: Normal range of motion.  Cardiovascular: Normal rate, regular rhythm. Good peripheral  circulation. Respiratory: Normal respiratory effort.  No retractions.  Gastrointestinal: Soft and nontender. No distention.  Genitourinary: Normal external genitalia.  Small amount of active bleeding from cervical os, slight pooling in the speculum. Musculoskeletal: No lower extremity edema.  Extremities warm and well perfused.  Right arm with full range of motion at all joints.  Mild tenderness to the forearm and hand.  No significant swelling, and no erythema, induration, or abnormal warmth.  2+ radial pulse.  Normal cap refill. Neurologic:  Normal speech and language. No gross focal neurologic deficits are appreciated.  Motor and sensory intact in median, radial, and ulnar distributions of right hand and arm. Skin:  Skin is warm and dry. No rash noted. Psychiatric: Mood and affect are normal. Speech and behavior are normal.  ____________________________________________   LABS (all labs ordered are listed, but only abnormal results are displayed)  Labs Reviewed  BASIC METABOLIC PANEL - Abnormal; Notable for the following components:      Result Value   Glucose, Bld 103 (*)    All other components within normal limits  CBC  POC URINE PREG, ED   ____________________________________________  EKG   ____________________________________________  RADIOLOGY    ____________________________________________   PROCEDURES  Procedure(s) performed: No  Procedures  Critical Care performed: No ____________________________________________   INITIAL IMPRESSION / ASSESSMENT AND PLAN / ED COURSE  Pertinent labs & imaging results that were available during my care of the patient were reviewed by me and considered in my medical decision making (see chart for details).   60 year old female with PMH as noted above presents with vaginal bleeding over the last few weeks.  She completed a course of Depo-Provera but then had rebound bleeding after it stopped,  despite being started on  norethindrone yesterday.  On exam the patient is well-appearing and her vital signs are normal.  She does have a small amount of active vaginal bleeding but no other significant exam findings.  She also reports right arm pain and has mild tenderness but the exam is otherwise reassuring.  I reviewed the past medical records in [place], however the patient has no recent visits documented.  She was seen at [facility] in 2020 for abnormal uterine bleeding and had an endometrial biopsy which found a polyp but no evidence of malignancy.  I will consult gynecology to discuss management of the vaginal bleeding.  The right arm pain is likely either paresthesias or mild contusion probably from sleeping on it in a strange position.  The arm is neuro/vascular intact with no swelling or cutaneous findings.  The patient has had similar episodes before that resolved on their own.  There is no evidence of DVT or other concerning etiology.  -----------------------------------------  12:57 PM on 06/16/2021 -----------------------------------------  I discussed the case with Dr. Amalia Hailey from OB/GYN who recommends that we give the patient norethindrone but at a higher dose.  He recommends 5 mg 3 times daily for the next several days until the bleeding subsides, and then 5 mg twice daily.  He recommends that she follow-up with Dr. Marcelline Mates sooner and I have instructed her to call the office to do so.  I counseled the patient on the results of the work-up and plan of care including medication and follow-up.  I gave her thorough return precautions and she expressed understanding.  ____________________________________________   FINAL CLINICAL IMPRESSION(S) / ED DIAGNOSES  Final diagnoses:  Abnormal vaginal bleeding      NEW MEDICATIONS STARTED DURING THIS VISIT:  New Prescriptions   NORETHINDRONE (AYGESTIN) 5 MG TABLET    Take 1 tablet (5 mg total) by mouth in the morning, at noon, and at  bedtime for 4 days, THEN 1 tablet (5 mg total) in the morning and at bedtime for 10 days.     Note:  This document was prepared using Dragon voice recognition software and may include unintentional dictation errors.    Arta Silence, MD 06/16/21 1258

## 2021-06-16 NOTE — Discharge Instructions (Addendum)
Stop taking the birth control pill packet that you were prescribed yesterday.  Instead, start taking the higher dose of that medication that was prescribed today.  You should take the medication 3 times daily for the next 3 days or until the bleeding slows down.  After this, you will take the medication twice a day until you follow-up.  Call [facility] either this afternoon or Monday.  Let them know that you were seen in the ER and that Dr. Amalia Hailey wanted you to follow-up with Dr. Marcelline Mates as soon as possible.  In the meantime, return to the ER for new, worsening, or persistent severe bleeding, weakness or lightheadedness, abdominal pain, fever, worsening arm pain, swelling, redness, numbness or weakness in the arm, or any other new or worsening symptoms that concern you.

## 2021-06-19 ENCOUNTER — Telehealth: Payer: Self-pay | Admitting: Obstetrics and Gynecology

## 2021-06-19 NOTE — Telephone Encounter (Signed)
There is a patient on my schedule on Nov 18, 11:00 who was recently dismissed from the practice due to no shows.  We can place this patient here.

## 2021-06-19 NOTE — Telephone Encounter (Signed)
Pt called stating that she was seen in the Er over the weekend for severe bleeding. She was told that she needs a sooner OB apt, she wants to see Dr.Cherry; she was already scheduled for the first available New Gyn on 01-12.  I do not have any sooner opening for New Gyn with [facility]. Pt states that she was advised by Dr.Evans. How do you suggest to proceed?

## 2021-06-30 ENCOUNTER — Telehealth: Payer: Self-pay | Admitting: Obstetrics and Gynecology

## 2021-06-30 ENCOUNTER — Ambulatory Visit (INDEPENDENT_AMBULATORY_CARE_PROVIDER_SITE_OTHER): Payer: Medicaid Other | Admitting: Obstetrics and Gynecology

## 2021-06-30 ENCOUNTER — Other Ambulatory Visit: Payer: Self-pay

## 2021-06-30 ENCOUNTER — Encounter: Payer: Self-pay | Admitting: Obstetrics and Gynecology

## 2021-06-30 VITALS — BP 97/63 | HR 76 | Ht 59.0 in | Wt 231.9 lb

## 2021-06-30 DIAGNOSIS — R079 Chest pain, unspecified: Secondary | ICD-10-CM | POA: Diagnosis not present

## 2021-06-30 DIAGNOSIS — D251 Intramural leiomyoma of uterus: Secondary | ICD-10-CM | POA: Diagnosis not present

## 2021-06-30 DIAGNOSIS — D252 Subserosal leiomyoma of uterus: Secondary | ICD-10-CM

## 2021-06-30 DIAGNOSIS — N938 Other specified abnormal uterine and vaginal bleeding: Secondary | ICD-10-CM | POA: Diagnosis not present

## 2021-06-30 NOTE — Telephone Encounter (Signed)
Pt came to check out stating that she needs to have a refill on norethindrone (AYGESTIN) 5 MG.  Pharm:  [facility] on [address].

## 2021-06-30 NOTE — Progress Notes (Signed)
GYNECOLOGY PROGRESS NOTE  Subjective:    Patient ID: Leah Stephens, female    DOB: October 14, 1960, 60 y.o.   MRN: [id number]  HPI  Patient is a 60 y.o. G9P0 female who presents for irregular menses and history of fibroids.  She was recently seen in the Emergency Room for heavy vaginal bleeding on 06/16/2021.  At that time, she had noted changing a pad almost every hour. She had been placed on Provera tablets for her bleeding prior to her ER visit, however was only for 10 days, and after stopping the medication the heavy bleeding ensued.  She was initiated on Aygestin tablets in the ER and has been on a tapering regimen since then. Notes the bleeding has resolved at this time.    Of note, she has had intermittent issues with heavy irregular bleeding in the past. Was evaluated 2-3 years ago with essentially negative workup (included ultrasound and endometrial biopsy) with the exception of several small uterine fibroids.  Notes that her cycles began to skip months during that time and she though she would be entering menopause soon so did not follow up.   Lastly,  Beyza complains of intermittent left arm pain along with chest pain. Denies SOB.  Symptoms have been ongoing for several weeks. Denies numbness or weakness.   The following portions of the patient's history were reviewed and updated as appropriate:  She  has a past medical history of Arthritis, Hypertension, and Obesity.  She  has a past surgical history that includes Cesarean section.  Her family history includes Arthritis in her father and mother; Diabetes in her father; Hypertension in her father and mother.  She  reports that she has never smoked. She has never used smokeless tobacco. She reports that she does not currently use alcohol. She reports that she does not use drugs.  Current Outpatient Medications on File Prior to Visit  Medication Sig Dispense Refill   lisinopril (PRINIVIL,ZESTRIL) 20 MG tablet Take 20 mg by mouth daily.      No current facility-administered medications on file prior to visit.   She has No Known Allergies..  Review of Systems Pertinent items noted in HPI and remainder of comprehensive ROS otherwise negative.   Objective:   Blood pressure 97/63, pulse 76, height 4\' 11"  (1.499 m), weight 231 lb 14.4 oz (105.2 kg), last menstrual period 06/12/2021. Body mass index is 46.84 kg/m. General appearance: alert and no distress CVS exam: normal rate, regular rhythm, normal S1, S2, no murmurs, rubs, clicks or gallops. Lungs: Normal respiratory effort, clear to auscultation, no crackles or wheezes. Abdomen: soft, non-tender; bowel sounds normal; no masses,  no organomegaly Pelvic: deferred Extremities: extremities normal, atraumatic, no cyanosis or edema Neurologic: Grossly normal    Imaging:  Patient Name: Leah Stephens DOB: 1961-07-01 MRN: [id number] ULTRASOUND REPORT   Location: [facility]  Date of Service: 02/03/2019        Indications:Irregular Bleeding. Findings:  The uterus is anteverted and measures 9.6 x 5.2 x 5.4 cm. Echo texture is heterogenous with evidence of focal masses. Within the uterus are multiple suspected fibroids measuring: Fibroid 1:Anterior Subserosal 1.5 x 1.8 x 1.7 cm. Fibroid 2:Posterior Submucosal 1.4 x 1.3 x 1.3 cm.   The Endometrium measures 15 mm.   Right Ovary measures 2.7 x 2.4 x 2.1 cm. It is normal in appearance. Left Ovary measures 3.2 x 2.3 x 2.5 cm. It is normal in appearance. Survey of the adnexa demonstrates no adnexal masses. There  is no free fluid in the cul de sac.   Impression: 1. Multiple Fibroids seen as described above. 2. Endometrium is in upper limites measuring 15 mm.   Recommendations: 1.Clinical correlation with the patient's History and Physical Exam.     Jenine M. Albertine Grates    RDMS     I have reviewed this study and agree with documented findings.      Rubie Maid, MD [facility]     Assessment:    1. DUB (dysfunctional uterine bleeding)   2. Intramural and subserous leiomyoma of uterus   3. Chest pain, unspecified type      Plan:   1. DUB (dysfunctional uterine bleeding) - US PELVIS (TRANSABDOMINAL ONLY); Future - US PELVIS TRANSVAGINAL NON-OB (TV ONLY); Future - Refill given on Aygestin - Patient desires surgical intervention with endometrial ablation. The risks of surgery were discussed in detail with the patient including but not limited to: bleeding which may require transfusion or reoperation; infection which may require prolonged hospitalization or re-hospitalization and antibiotic therapy; injury to bowel, bladder, ureters and major vessels or other surrounding organs; formation of adhesions; need for additional procedures including laparotomy or subsequent procedures secondary to abnormal pathology; thromboembolic phenomenon; incisional problems and other postoperative or anesthesia complications.  Patient was told that the likelihood that her condition and symptoms will be treated effectively with this surgical management was very high; the postoperative expectations were also discussed in detail. The patient also understands the alternative treatment options which were discussed in full. All questions were answered.  She was told that she will be contacted by our surgical scheduler regarding the time and date of her surgery; routine preoperative instructions will be given to her by the preoperative nursing team.   Printed patient education handouts about the procedure were given to the patient to review at home.  Desires surgery on 12/12. To return for pre-op and endometrial biopsy in 3 weeks.    2. Intramural and subserous leiomyoma of uterus - US PELVIS (TRANSABDOMINAL ONLY); Future - US PELVIS TRANSVAGINAL NON-OB (TV ONLY); Future - To reassess size and location of fibroids.   3. Chest pain, unspecified type - Ambulatory referral to Cardiology for pre-operative  evaluation   A total of 25 minutes were spent face-to-face with the patient during this encounter and over half of that time involved counseling and coordination of care.   Rubie Maid, MD [facility]

## 2021-07-02 ENCOUNTER — Encounter: Payer: Self-pay | Admitting: Obstetrics and Gynecology

## 2021-07-02 MED ORDER — NORETHINDRONE ACETATE 5 MG PO TABS: 5.0000 mg | ORAL_TABLET | Freq: Every day | ORAL | 2 refills | Status: DC

## 2021-07-03 ENCOUNTER — Ambulatory Visit (INDEPENDENT_AMBULATORY_CARE_PROVIDER_SITE_OTHER): Payer: Medicaid Other | Admitting: Cardiovascular Disease

## 2021-07-03 ENCOUNTER — Other Ambulatory Visit: Payer: Self-pay

## 2021-07-03 ENCOUNTER — Encounter: Payer: Self-pay | Admitting: Cardiovascular Disease

## 2021-07-03 VITALS — BP 100/68 | HR 74 | Ht 59.0 in | Wt 232.2 lb

## 2021-07-03 DIAGNOSIS — I9589 Other hypotension: Secondary | ICD-10-CM | POA: Diagnosis not present

## 2021-07-03 DIAGNOSIS — Z6841 Body Mass Index (BMI) 40.0 and over, adult: Secondary | ICD-10-CM

## 2021-07-03 DIAGNOSIS — R030 Elevated blood-pressure reading, without diagnosis of hypertension: Secondary | ICD-10-CM | POA: Insufficient documentation

## 2021-07-03 DIAGNOSIS — R079 Chest pain, unspecified: Secondary | ICD-10-CM | POA: Diagnosis not present

## 2021-07-03 DIAGNOSIS — E669 Obesity, unspecified: Secondary | ICD-10-CM | POA: Insufficient documentation

## 2021-07-03 DIAGNOSIS — I951 Orthostatic hypotension: Secondary | ICD-10-CM

## 2021-07-03 MED ORDER — LISINOPRIL-HYDROCHLOROTHIAZIDE 10-12.5 MG PO TABS: 1.0000 | ORAL_TABLET | ORAL | 3 refills | Status: AC | PRN

## 2021-07-03 NOTE — Patient Instructions (Addendum)
Medication Instructions:  Blood pressure is low today: Hold the blood pressure pill Zestoretic  Monitor blood pressure  If it runs >130, take a 1/2 pill daily, and monitor  If you need a refill on your cardiac medications before your next appointment, please call your pharmacy.    Lab work: No new labs needed  Testing/Procedures: No new testing needed  Follow-Up: At [facility], you and your health needs are our priority.  As part of our continuing mission to provide you with exceptional heart care, we have created designated Provider Care Teams.  These Care Teams include your primary Cardiologist (physician) and Advanced Practice Providers (APPs -  Physician Assistants and Nurse Practitioners) who all work together to provide you with the care you need, when you need it.  You will need a follow up appointment as needed  Providers on your designated Care Team:   Murray Hodgkins, NP Christell Faith, PA-C Cadence Kathlen Mody, [place]  COVID-19 Vaccine Information can be found at: ShippingScam.co.uk For questions related to vaccine distribution or appointments, please email [email or web address] .[email or web address] or call 8656904981.   Blood Pressure  Please monitor blood pressures and keep a log of your readings.   How to use a home blood pressure monitor. Be still. Measure at the same time every day. It's important to take the readings at the same time each day, such as morning and evening.    AVOID these things for 30 minutes before checking your blood pressure: Drinking caffeine. Drinking alcohol. Eating. Smoking. Exercising.

## 2021-07-03 NOTE — Progress Notes (Signed)
Cardiology Office Note  Date:  07/03/2021   ID:  Leah Stephens, DOB [birth year], MRN [id number]  PCP:  [facility]   Chief Complaint  Patient presents with   New Patient (Initial Visit)    Ref by Dr. Marcelline Mates for chest pain. Medications reviewed by the patient verbally. Patient c/o having several episodes of chest pain that radiated down her left arm.      HPI:  Leah Stephens is a 59 year old woman with past medical history of Irregular menses/fibroids, heavy vaginal bleeding in the ER June 16, 2021 Obesity Who presents by referral from Dr. Judeen Hammans for consultation of her chest pain  She reports that she is active, no chest pain on exertion Followed by OB/GYN for Irregular menstrual bleeding past 2 to 3 years  On her visit with OB/GYN, reported having intermittent left arm pain along with chest pain No shortness of breath  Reports that she woke up 1 night had right shoulder discomfort Symptoms resolved on their own without intervention Another night woke up with left upper chest and left shoulder discomfort, hurt to palpate the area, took 2 Tylenol She lay on her back, symptoms eventually resolved without intervention Has not had much symptoms since that time, has been active  Never smoked  Orthostasis last week, severe episode, almost passed out Was shopping at the time, had to sit down, Clark had to give her a bottle of water, slowly felt better  BP low today 809 systolic BP low with Dr.Cherry, 98 systolic  Nonsmoker Nondiabetic "Cholesterol is good"  In terms of family history,  Mother with diabetes, mother and father with hypertension  EKG personally reviewed by myself on todays visit Normal sinus rhythm rate 79 bpm no significant ST or T wave changes  PMH:   has a past medical history of Arthritis, Hypertension, and Obesity.  PSH:    Past Surgical History:  Procedure Laterality Date   CESAREAN SECTION      Current Outpatient Medications   Medication Sig Dispense Refill   meloxicam (MOBIC) 15 MG tablet Take 15 mg by mouth daily as needed.     norethindrone (AYGESTIN) 5 MG tablet Take 1 tablet (5 mg total) by mouth daily. 30 tablet 2   lisinopril-hydrochlorothiazide (ZESTORETIC) 10-12.5 MG tablet Take 1 tablet by mouth as needed. Take for BP>130 60 tablet 3   No current facility-administered medications for this visit.    Allergies:   Patient has no known allergies.   Social History:  The patient  reports that she has never smoked. She has never used smokeless tobacco. She reports that she does not currently use alcohol. She reports that she does not use drugs.   Family History:   family history includes Arthritis in her father and mother; Diabetes in her father; Hypertension in her father and mother.    Review of Systems: Review of Systems  Constitutional: Negative.   HENT: Negative.    Respiratory: Negative.    Cardiovascular:        Shoulder and left upper chest pain  Gastrointestinal: Negative.   Musculoskeletal: Negative.   Neurological: Negative.   Psychiatric/Behavioral: Negative.    All other systems reviewed and are negative.   PHYSICAL EXAM: VS:  BP 100/68 (BP Location: Right Arm, Patient Position: Sitting, Cuff Size: Large)   Pulse 74   Ht 4\' 11"  (1.499 m)   Wt 232 lb 4 oz (105.3 kg)   LMP 06/12/2021 (Exact Date)   SpO2 99%  BMI 46.91 kg/m  , BMI Body mass index is 46.91 kg/m. GEN: Well nourished, well developed, in no acute distress HEENT: normal Neck: no JVD, carotid bruits, or masses Cardiac: RRR; no murmurs, rubs, or gallops,no edema  Respiratory:  clear to auscultation bilaterally, normal work of breathing GI: soft, nontender, nondistended, + BS MS: no deformity or atrophy Skin: warm and dry, no rash Neuro:  Strength and sensation are intact Psych: euthymic mood, full affect  Recent Labs: 06/16/2021: BUN 16; Creatinine, Ser 0.98; Hemoglobin 13.7; Platelets 300; Potassium 3.9; Sodium  140    Lipid Panel No results found for: CHOL, HDL, LDLCALC, TRIG    Wt Readings from Last 3 Encounters:  07/03/21 232 lb 4 oz (105.3 kg)  06/30/21 231 lb 14.4 oz (105.2 kg)  06/16/21 236 lb 8.9 oz (107.3 kg)     ASSESSMENT AND PLAN:  Problem List Items Addressed This Visit     Chest pain of uncertain etiology   Elevated blood pressure reading   Obesity   Other Visit Diagnoses     Other specified hypotension    -  Primary   Relevant Medications   lisinopril-hydrochlorothiazide (ZESTORETIC) 10-12.5 MG tablet   Other Relevant Orders   EKG 12-Lead      Chest pain/shoulder pain Atypical in nature, likely musculoskeletal, no further cardiac work-up needed  Hypotension Blood pressure low with Dr. Marcelline Mates, blood pressure low today Reports severe orthostasis episode last week Recommend she stop the lisinopril HCTZ Systolic pressure on my check around 100 today Denies being dehydrated, plenty of fluids today Suggest she continue to monitor blood pressure at home  Obesity We have encouraged continued exercise, careful diet management in an effort to lose weight.     Total encounter time more than 60 minutes  Greater than 50% was spent in counseling and coordination of care with the patient  Patient seen in consultation for Dr. Marcelline Mates and will be referred back to her office for ongoing care of the issues detailed above  Signed, Esmond Plants, M.D., Ph.D. [place], [place]

## 2021-07-12 ENCOUNTER — Telehealth: Payer: Self-pay | Admitting: Cardiovascular Disease

## 2021-07-12 NOTE — Telephone Encounter (Signed)
Patient states BP 150/90 and was advised by Dr. Rockey Situ to cut dose of Zestoretic to half the dose if BP is elevated, please assist

## 2021-07-12 NOTE — Telephone Encounter (Signed)
Was able to return call to Mrs. Leah Stephens, she reports her BP is 152/90, stated she forgot her instructions on her BP medication as it was changed at last OV with Dr. Rockey Situ on 11/21.  Advised pt of instructions on her lisinopril-hydrochlorothiazide (ZESTORETIC). Monitor blood pressure  If it runs >130, take a 1/2 pill daily, and monitor  At her appt her BP was SBP 100 (low) as to why he advised to hold and use for SBP >130. Okay to take half pill at this time, if this evening still SBP>130, then may take other half.  Leah Stephens verbalized understanding and thankful for review her instructions with her again. Will call back with any further concerns.

## 2021-07-14 ENCOUNTER — Encounter
Admission: RE | Admit: 2021-07-14 | Discharge: 2021-07-14 | Disposition: A | Discharge status: Home / Self Care | Payer: Disability Insurance | Source: Ambulatory Visit | Attending: Obstetrics and Gynecology | Admitting: Obstetrics and Gynecology

## 2021-07-14 ENCOUNTER — Other Ambulatory Visit: Payer: Self-pay

## 2021-07-14 DIAGNOSIS — Z79899 Other long term (current) drug therapy: Secondary | ICD-10-CM

## 2021-07-14 NOTE — Patient Instructions (Addendum)
Your procedure is scheduled on:07-24-21 Monday Report to the Registration Desk on the 1st floor of the [facility].Then proceed to the 2nd floor Surgery Desk in the [facility] To find out your arrival time, please call [phone] between 1PM - 3PM on:07-21-21 Friday  REMEMBER: Instructions that are not followed completely may result in serious medical risk, up to and including death; or upon the discretion of your surgeon and anesthesiologist your surgery may need to be rescheduled.  Do not eat food after midnight the night before surgery.  No gum chewing, lozengers or hard candies.  You may however, drink CLEAR liquids up to 2 hours before you are scheduled to arrive for your surgery. Do not drink anything within 2 hours of your scheduled arrival time.  Clear liquids include: - water  - apple juice without pulp - gatorade (not RED, PURPLE, OR BLUE) - black coffee or tea (Do NOT add milk or creamers to the coffee or tea) Do NOT drink anything that is not on this list.  Do not take any medication the day of surgery  One week prior to surgery: Stop Anti-inflammatories (NSAIDS) such as Mobic (Meloxicam), Advil, Aleve, Ibuprofen, Motrin, Naproxen, Naprosyn and Aspirin based products such as Excedrin, Goodys Powder, BC Powder.You may however, take Tylenol if needed for pain up until the day of surgery.  Stop ANY OVER THE COUNTER supplements/vitamins 7 days prior to surgery   No Alcohol for 24 hours before or after surgery.  No Smoking including e-cigarettes for 24 hours prior to surgery.  No chewable tobacco products for at least 6 hours prior to surgery.  No nicotine patches on the day of surgery.  Do not use any "recreational" drugs for at least a week prior to your surgery.  Please be advised that the combination of cocaine and anesthesia may have negative outcomes, up to and including death. If you test positive for cocaine, your surgery will be cancelled.  On the morning of  surgery brush your teeth with toothpaste and water, you may rinse your mouth with mouthwash if you wish. Do not swallow any toothpaste or mouthwash  Do not wear jewelry, make-up, hairpins, clips or nail polish.  Do not wear lotions, powders, or perfumes.   Do not shave body from the neck down 48 hours prior to surgery just in case you cut yourself which could leave a site for infection.  Also, freshly shaved skin may become irritated if using the CHG soap.  Contact lenses, hearing aids and dentures may not be worn into surgery.  Do not bring valuables to the hospital. The [facility] is not responsible for any missing/lost belongings or valuables.   Notify your doctor if there is any change in your medical condition (cold, fever, infection).  Wear comfortable clothing (specific to your surgery type) to the hospital.  After surgery, you can help prevent lung complications by doing breathing exercises.  Take deep breaths and cough every 1-2 hours. Your doctor may order a device called an Incentive Spirometer to help you take deep breaths. When coughing or sneezing, hold a pillow firmly against your incision with both hands. This is called "splinting." Doing this helps protect your incision. It also decreases belly discomfort.  If you are being admitted to the hospital overnight, leave your suitcase in the car. After surgery it may be brought to your room.  If you are being discharged the day of surgery, you will not be allowed to drive home. You will need a  responsible adult (18 years or older) to drive you home and stay with you that night.   If you are taking public transportation, you will need to have a responsible adult (18 years or older) with you. Please confirm with your physician that it is acceptable to use public transportation.   Please call the [facility] at [phone] if you have any questions about these instructions.  Surgery Visitation  Policy:  Patients undergoing a surgery or procedure may have one family member or support person with them as long as that person is not COVID-19 positive or experiencing its symptoms.  That person may remain in the waiting area during the procedure and may rotate out with other people.  Inpatient Visitation:    Visiting hours are 7 a.m. to 8 p.m. Up to two visitors ages 16+ are allowed at one time in a patient room. The visitors may rotate out with other people during the day. Visitors must check out when they leave, or other visitors will not be allowed. One designated support person may remain overnight. The visitor must pass COVID-19 screenings, use hand sanitizer when entering and exiting the patient's room and wear a mask at all times, including in the patient's room. Patients must also wear a mask when staff or their visitor are in the room. Masking is required regardless of vaccination status.

## 2021-07-20 ENCOUNTER — Encounter: Payer: Self-pay | Admitting: Obstetrics and Gynecology

## 2021-07-20 ENCOUNTER — Ambulatory Visit (INDEPENDENT_AMBULATORY_CARE_PROVIDER_SITE_OTHER): Payer: Medicaid Other | Admitting: Obstetrics and Gynecology

## 2021-07-20 ENCOUNTER — Other Ambulatory Visit (HOSPITAL_COMMUNITY)
Admission: RE | Admit: 2021-07-20 | Discharge: 2021-07-20 | Disposition: A | Discharge status: Home / Self Care | Payer: Medicaid Other | Source: Ambulatory Visit | Attending: Obstetrics and Gynecology | Admitting: Obstetrics and Gynecology

## 2021-07-20 ENCOUNTER — Other Ambulatory Visit: Payer: Self-pay

## 2021-07-20 ENCOUNTER — Encounter
Admission: RE | Admit: 2021-07-20 | Discharge: 2021-07-20 | Disposition: A | Discharge status: Home / Self Care | Payer: Medicaid Other | Source: Ambulatory Visit | Attending: Obstetrics and Gynecology | Admitting: Obstetrics and Gynecology

## 2021-07-20 ENCOUNTER — Ambulatory Visit: Payer: Medicaid Other

## 2021-07-20 VITALS — BP 132/88 | HR 68 | Ht 59.0 in | Wt 231.6 lb

## 2021-07-20 DIAGNOSIS — N938 Other specified abnormal uterine and vaginal bleeding: Secondary | ICD-10-CM | POA: Insufficient documentation

## 2021-07-20 DIAGNOSIS — D251 Intramural leiomyoma of uterus: Secondary | ICD-10-CM

## 2021-07-20 DIAGNOSIS — Z79899 Other long term (current) drug therapy: Secondary | ICD-10-CM

## 2021-07-20 DIAGNOSIS — N858 Other specified noninflammatory disorders of uterus: Secondary | ICD-10-CM

## 2021-07-20 DIAGNOSIS — D252 Subserosal leiomyoma of uterus: Secondary | ICD-10-CM

## 2021-07-20 DIAGNOSIS — Z01812 Encounter for preprocedural laboratory examination: Secondary | ICD-10-CM | POA: Diagnosis not present

## 2021-07-20 LAB — CBC
HCT: 43.8 % (ref 36.0–46.0)
Hemoglobin: 14.1 g/dL (ref 12.0–15.0)
MCH: 28.4 pg (ref 26.0–34.0)
MCHC: 32.2 g/dL (ref 30.0–36.0)
MCV: 88.1 fL (ref 80.0–100.0)
Platelets: 328 10*3/uL (ref 150–400)
RBC: 4.97 MIL/uL (ref 3.87–5.11)
RDW: 15.8 % — ABNORMAL HIGH (ref 11.5–15.5)
WBC: 5.8 10*3/uL (ref 4.0–10.5)
nRBC: 0 % (ref 0.0–0.2)

## 2021-07-20 LAB — POTASSIUM: Potassium: 3.7 mmol/L (ref 3.5–5.1)

## 2021-07-20 NOTE — H&P (Signed)
GYNECOLOGY PREOPERATIVE HISTORY AND PHYSICAL  Subjective:  Leah Stephens is a 60 y.o. G4P0 here for surgical management of dysfunctional uterine bleeding.  Also with a h/o small uterine fibroids, and possible focal adenomyosis.  No significant preoperative concerns.  Proposed surgery: Hysteroscopy Dilation and Curettage, Endometrial Ablation ([place])    Pertinent Gynecological History: Menses: flow is moderate and irregular occurring approximately every 30-60 days without intermenstrual spotting Bleeding: dysfunctional uterine bleeding Contraception: none Last mammogram: patient is overdue. Last performed in 2015.Marland Kitchen  Last pap: normal Date: [id number]   Past Medical History:  Diagnosis Date   Arthritis    Hypertension    Obesity     Past Surgical History:  Procedure Laterality Date   CESAREAN SECTION     NO PAST SURGERIES      OB History  Gravida Para Term Preterm AB Living  4         4  SAB IAB Ectopic Multiple Live Births               # Outcome Date GA Lbr Len/2nd Weight Sex Delivery Anes PTL Lv  4 Gravida           3 Gravida           2 Gravida           1 Gravida             Family History  Problem Relation Age of Onset   Hypertension Mother    Arthritis Mother    Hypertension Father    Arthritis Father    Diabetes Father    Breast cancer Neg Hx     Social History   Socioeconomic History   Marital status: Single    Spouse name: Not on file   Number of children: Not on file   Years of education: Not on file   Highest education level: Not on file  Occupational History   Not on file  Tobacco Use   Smoking status: Never   Smokeless tobacco: Never  Vaping Use   Vaping Use: Never used  Substance and Sexual Activity   Alcohol use: Yes    Comment: occ   Drug use: No   Sexual activity: Not Currently    Birth control/protection: Pill  Other Topics Concern   Not on file  Social History Narrative   Not on file   Social Determinants of Health    Financial Resource Strain: Not on file  Food Insecurity: Not on file  Transportation Needs: Not on file  Physical Activity: Not on file  Stress: Not on file  Social Connections: Not on file  Intimate Partner Violence: Not on file    Current Outpatient Medications on File Prior to Visit  Medication Sig Dispense Refill   lisinopril-hydrochlorothiazide (ZESTORETIC) 10-12.5 MG tablet Take 1 tablet by mouth as needed. Take for BP>130 (Patient taking differently: Take 0.5 tablets by mouth as needed. Take for BP>130) 60 tablet 3   meloxicam (MOBIC) 15 MG tablet Take 15 mg by mouth daily as needed.     norethindrone (AYGESTIN) 5 MG tablet Take 1 tablet (5 mg total) by mouth daily. 30 tablet 2   No current facility-administered medications on file prior to visit.   No Known Allergies    Review of Systems Constitutional: No recent fever/chills/sweats Respiratory: No recent cough/bronchitis Cardiovascular: No chest pain Gastrointestinal: No recent nausea/vomiting/diarrhea Genitourinary: No UTI symptoms Hematologic/lymphatic:No history of coagulopathy or recent blood thinner use  Objective:   Blood pressure 132/88, pulse 68, height 4\' 11"  (1.499 m), weight 231 lb 9.6 oz (105.1 kg). CONSTITUTIONAL: Well-developed, well-nourished female in no acute distress.  HENT:  Normocephalic, atraumatic, External right and left ear normal. Oropharynx is clear and moist EYES: Conjunctivae and EOM are normal. Pupils are equal, round, and reactive to light. No scleral icterus.  NECK: Normal range of motion, supple, no masses SKIN: Skin is warm and dry. No rash noted. Not diaphoretic. No erythema. No pallor. NEUROLOGIC: Alert and oriented to person, place, and time. Normal reflexes, muscle tone coordination. No cranial nerve deficit noted. PSYCHIATRIC: Normal mood and affect. Normal behavior. Normal judgment and thought content. CARDIOVASCULAR: Normal heart rate noted, regular rhythm RESPIRATORY:  Effort and breath sounds normal, no problems with respiration noted ABDOMEN: Soft, nontender, nondistended. PELVIC: Deferred MUSCULOSKELETAL: Normal range of motion. No edema and no tenderness. 2+ distal pulses.    Labs: Results for orders placed or performed during the hospital encounter of 07/20/21 (from the past 336 hour(s))  Potassium   Collection Time: 07/20/21  1:09 PM  Result Value Ref Range   Potassium 3.7 3.5 - 5.1 mmol/L  CBC   Collection Time: 07/20/21  1:09 PM  Result Value Ref Range   WBC 5.8 4.0 - 10.5 K/uL   RBC 4.97 3.87 - 5.11 MIL/uL   Hemoglobin 14.1 12.0 - 15.0 g/dL   HCT 43.8 36.0 - 46.0 %   MCV 88.1 80.0 - 100.0 fL   MCH 28.4 26.0 - 34.0 pg   MCHC 32.2 30.0 - 36.0 g/dL   RDW 15.8 (H) 11.5 - 15.5 %   Platelets 328 150 - 400 K/uL   nRBC 0.0 0.0 - 0.2 %    Pathology:  Endometrial biopsy pending  Imaging Studies (performed at [facility]):  Procedure Note   Olinger, Serita Sheller, MD - 05/10/2021  Formatting of this note might be different from the original.  EXAM: [place] ENDOVAGINAL (NON-OB)  DATE: 05/10/2021 12:10 PM  ACCESSION: [id number]  DICTATED: 05/10/2021 12:10 PM  INTERPRETATION LOCATION: [facility]   CLINICAL INDICATION: 60 years old Female with irregular menses  - N92.6 - Irregular menses     TECHNIQUE: Ultrasound views of the pelvis were obtained endovaginally and transabdominally using gray scale and color Doppler imaging.   COMPARISON: No priors available   FINDINGS:   UTERUS/CERVIX: The uterus is slightly enlarged with heterogeneous echogenicity. Right-sided 2.2 x 1.7 x 2.4 cm pedunculated fibroid located along the anterior fundus. Additional 2.5 x 2.3 x 2.8 cm intramural fibroid at the left mid uterine body. The endometrium was normal in thickness for a premenopausal patient. Cystic focus at the anterior mid endometrium/ near the junctional zone measuring up to 30mm.   Multiple cervical nabothian cysts.   OVARIES: The ovaries were  seen not visualized transvaginally or transabdominally.   OTHER: No abnormal pelvic free fluid.   Please see below for data measurements:  LMP: 04/09/21  Uterus: Sagittal 9.8 cm; AP cm; Transverse cm  Endometrium: 1.00 (cm) cm   IMPRESSION:   --Enlarged fibroid uterus, as above.  --Small cystic focus along the anterior endometrium adjacent to the the junctional zone in the mid uterine body, nonspecific but which can be seen with adenomyosis. Otherwise unremarkable appearance of the endometrium measuring up to 1.0 cm in this premenopausal patient.  --The ovaries were not visualized transvaginally or transabdominally.  Assessment:    1. DUB (dysfunctional uterine bleeding)   2. Uterine cyst   3. Intramural and  subserous leiomyoma of uterus       Plan:   Counseling: Procedure, risks, reasons, benefits and complications (including injury to bowel, bladder, major blood vessel, ureter, bleeding, possibility of transfusion, infection, or fistula formation) reviewed in detail. Likelihood of success in alleviating the patient's condition was discussed. Routine postoperative instructions will be reviewed with the patient and her family in detail after surgery.  The patient concurred with the proposed plan, giving informed written consent for the surgery.   Preop testing reviewed. Instructions reviewed, including NPO after midnight.      Rubie Maid, MD [facility]

## 2021-07-20 NOTE — Progress Notes (Signed)
GYNECOLOGY PROGRESS NOTE  Subjective:    Patient ID: [name], female    DOB: Jun 25, 1961, 60 y.o.   MRN: [id number]  HPI  Patient is a 60 y.o. G72P0 female who presents for preop and endometrial biopsy. Plans to have an endometrial ablation for irregular menstrual cycles. Also has a h/o small fibroids. Currently using Aygestin to manage her bleeding. States that she had not seen any bleeding in almost 2 weeks, but noted spotting today.   The following portions of the patient's history were reviewed and updated as appropriate: allergies, current medications, past family history, past medical history, past social history, past surgical history, and problem list.  Review of Systems Pertinent items noted in HPI and remainder of comprehensive ROS otherwise negative.   Objective:   Blood pressure 132/88, pulse 68, height 4\' 11"  (1.499 m), weight 231 lb 9.6 oz (105.1 kg).  Body mass index is 46.78 kg/m. General appearance: alert and no distress Abdomen: soft, non-tender; bowel sounds normal; no masses,  no organomegaly Pelvic: external genitalia normal, rectovaginal septum normal.  Vagina without discharge.  Cervix normal appearing, no lesions and no motion tenderness.  Uterus mobile, nontender, normal shape and size.  Adnexae non-palpable, nontender bilaterally.  Extremities: extremities normal, atraumatic, no cyanosis or edema Neurologic: Grossly normal    Imaging (from [facility]) :   Procedure Note  Olinger, Serita Sheller, MD - 05/10/2021  Formatting of this note might be different from the original.  EXAM: [place] ENDOVAGINAL (NON-OB)  DATE: 05/10/2021 12:10 PM  ACCESSION: [id number]  DICTATED: 05/10/2021 12:10 PM  INTERPRETATION LOCATION: [place]   CLINICAL INDICATION: 60 years old Female with irregular menses  - N92.6 - Irregular menses     TECHNIQUE: Ultrasound views of the pelvis were obtained endovaginally and transabdominally using gray scale and color Doppler  imaging.   COMPARISON: No priors available   FINDINGS:   UTERUS/CERVIX: The uterus is slightly enlarged with heterogeneous echogenicity. Right-sided 2.2 x 1.7 x 2.4 cm pedunculated fibroid located along the anterior fundus. Additional 2.5 x 2.3 x 2.8 cm intramural fibroid at the left mid uterine body. The endometrium was normal in thickness for a premenopausal patient. Cystic focus at the anterior mid endometrium/ near the junctional zone measuring up to 35mm.   Multiple cervical nabothian cysts.   OVARIES: The ovaries were seen not visualized transvaginally or transabdominally.   OTHER: No abnormal pelvic free fluid.   Please see below for data measurements:  LMP: 04/09/21  Uterus: Sagittal 9.8 cm; AP cm; Transverse cm  Endometrium: 1.00 (cm) cm   IMPRESSION:   --Enlarged fibroid uterus, as above.  --Small cystic focus along the anterior endometrium adjacent to the the junctional zone in the mid uterine body, nonspecific but which can be seen with adenomyosis. Otherwise unremarkable appearance of the endometrium measuring up to 1.0 cm in this premenopausal patient.  --The ovaries were not visualized transvaginally or transabdominally.  Assessment:   1. DUB (dysfunctional uterine bleeding)   2. Uterine cyst   3. Intramural and subserous leiomyoma of uterus      Plan:   - Endometrial biopsy performed for dysfunctional uterine bleeding (see procedure note below).  - Uterine cyst noted on ultrasound, possibly related to adenomyosis, appears focal.  Will not likely interfere with ablation procedure. She reports a h/o painful cycles in the past as a teenager, but nothing recent.  - Patient previously counseled on risks/benefits of surgery.  All questions answered. Had preop exam at hospital  earlier today. Surgery scheduled for 12/12.     Endometrial Biopsy Procedure Note  The patient is positioned on the exam table in the dorsal lithotomy position. Bimanual exam confirms uterine  position and size. A Graves speculum is placed into the vagina. A single toothed tenaculum is placed onto the anterior lip of the cervix. The pipette is placed into the endocervical canal and is advanced to the uterine fundus. Using a piston like technique, with vacuum created by withdrawing the stylus, the endometrial specimen is obtained and transferred to the biopsy container. Minimal bleeding is encountered. The procedure is well tolerated.   Uterine Position:mid   Uterine Length: 9 cm   Uterine Specimen: Average   Post procedure instructions are given. The patient is scheduled for follow up appointment.    Rubie Maid, MD [facility]

## 2021-07-20 NOTE — Patient Instructions (Addendum)
GYNECOLOGY PRE-OPERATIVE INSTRUCTIONS  You are scheduled for surgery on 07/24/2021.  The name of your procedure is: Hysteroscopy D&C with endometrial ablation.   Please read through these instructions carefully regarding preparation for your surgery: Nothing to eat after midnight on the day prior to surgery.  Do not take any medications unless recommended by your provider on day prior to surgery.  Do not take NSAIDs (Motrin, Aleve) or aspirin 7 days prior to surgery.  You may take Tylenol products for minor aches and pains.  You will receive a prescription for pain medications post-operatively.  You will be contacted by phone approximately 1-2 weeks prior to surgery to schedule your pre-operative appointment.  If you are being admitted to the hospital for an overnight stay, you will require COVID testing prior to surgery. Instructions will be given to you on when and where to have this performed.  Please call the office if you have any questions regarding your upcoming surgery.    Thank you for choosing [facility].    Endometrial Ablation Endometrial ablation is a procedure that destroys the thin inner layer of the lining of the uterus (endometrium). This procedure may be done: To stop heavy menstrual periods. To stop bleeding that is causing anemia. To control irregular bleeding. To treat bleeding caused by small tumors (fibroids) in the endometrium. This procedure is often done as an alternative to major surgery, such as removal of the uterus and cervix (hysterectomy). As a result of this procedure: You may not be able to have children. However, if you have not yet gone through menopause: You may still have a small chance of getting pregnant. You will need to use a reliable method of birth control after the procedure to prevent pregnancy. You may stop having a menstrual period, or you may have only a small amount of bleeding during your period. Menstruation may return several  years after the procedure. Tell a health care provider about: Any allergies you have. All medicines you are taking, including vitamins, herbs, eye drops, creams, and over-the-counter medicines. Any problems you or family members have had with the use of anesthetic medicines. Any blood disorders you have. Any surgeries you have had. Any medical conditions you have. Whether you are pregnant or may be pregnant. What are the risks? Generally, this is a safe procedure. However, problems may occur, including: A hole (perforation) in the uterus or bowel. Infection in the uterus, bladder, or vagina. Bleeding. Allergic reaction to medicines. Damage to nearby structures or organs. An air bubble in the lung (air embolus). Problems with pregnancy. Failure of the procedure. Decreased ability to diagnose cancer in the endometrium. Scar tissue forms after the procedure, making it more difficult to get a sample of the uterine lining. What happens before the procedure? Medicines Ask your health care provider about: Changing or stopping your regular medicines. This is especially important if you take diabetes medicines or blood thinners. Taking medicines such as aspirin and ibuprofen. These medicines can thin your blood. Do not take these medicines before your procedure if your doctor tells you not to take them. Taking over-the-counter medicines, vitamins, herbs, and supplements. Tests You will have tests of your endometrium to make sure there are no precancerous cells or cancer cells present. You may have an ultrasound of the uterus. General instructions Do not use any products that contain nicotine or tobacco for at least 4 weeks before the procedure. These include cigarettes, chewing tobacco, and vaping devices, such as e-cigarettes. If you  need help quitting, ask your health care provider. You may be given medicines to thin the endometrium. Ask your health care provider what steps will be taken to  help prevent infection. These steps may include: Removing hair at the surgery site. Washing skin with a germ-killing soap. Taking antibiotic medicine. Plan to have a responsible adult take you home from the hospital or clinic. Plan to have a responsible adult care for you for the time you are told after you leave the hospital or clinic. This is important. What happens during the procedure?  You will lie on an exam table with your feet and legs supported as in a pelvic exam. An IV will be inserted into one of your veins. You will be given a medicine to help you relax (sedative). A surgical tool with a light and camera (resectoscope) will be inserted into your vagina and moved into your uterus. This allows your surgeon to see inside your uterus. Endometrial tissue will be destroyed and removed, using one of the following methods: Radiofrequency. This uses an electrical current to destroy the endometrium. Cryotherapy. This uses extreme cold to freeze the endometrium. Heated fluid. This uses a heated salt and water (saline) solution to destroy the endometrium. Microwave. This uses high-energy microwaves to heat up the endometrium and destroy it. Thermal balloon. This involves inserting a catheter with a balloon tip into the uterus. The balloon tip is filled with heated fluid to destroy the endometrium. The procedure may vary among health care providers and hospitals. What happens after the procedure? Your blood pressure, heart rate, breathing rate, and blood oxygen level will be monitored until you leave the hospital or clinic. You may have vaginal bleeding for 4-6 weeks after the procedure. You may also have: Cramps. A thin, watery vaginal discharge that is light pink or brown. A need to urinate more than usual. Nausea. If you were given a sedative during the procedure, it can affect you for several hours. Do not drive or operate machinery until your health care provider says that it is  safe. Do not have sex or insert anything into your vagina until your health care provider says it is safe. Summary Endometrial ablation is done to treat many causes of heavy menstrual bleeding. The procedure destroys the thin inner layer of the lining of the uterus (endometrium). This procedure is often done as an alternative to major surgery, such as removal of the uterus and cervix (hysterectomy). Plan to have a responsible adult take you home from the hospital or clinic. This information is not intended to replace advice given to you by your health care provider. Make sure you discuss any questions you have with your health care provider. Document Revised: 02/18/2020 Document Reviewed: 02/18/2020 Elsevier Patient Education  [place].

## 2021-07-20 NOTE — H&P (View-Only) (Signed)
GYNECOLOGY PREOPERATIVE HISTORY AND PHYSICAL  Subjective:  Leah Stephens is a 60 y.o. G4P0 here for surgical management of dysfunctional uterine bleeding.  Also with a h/o small uterine fibroids, and possible focal adenomyosis.  No significant preoperative concerns.  Proposed surgery: Hysteroscopy Dilation and Curettage, Endometrial Ablation (Minerva)    Pertinent Gynecological History: Menses: flow is moderate and irregular occurring approximately every 30-60 days without intermenstrual spotting Bleeding: dysfunctional uterine bleeding Contraception: none Last mammogram: patient is overdue. Last performed in 2015.Marland Kitchen  Last pap: normal Date: [id number]   Past Medical History:  Diagnosis Date   Arthritis    Hypertension    Obesity     Past Surgical History:  Procedure Laterality Date   CESAREAN SECTION     NO PAST SURGERIES      OB History  Gravida Para Term Preterm AB Living  4         4  SAB IAB Ectopic Multiple Live Births               # Outcome Date GA Lbr Len/2nd Weight Sex Delivery Anes PTL Lv  4 Gravida           3 Gravida           2 Gravida           1 Gravida             Family History  Problem Relation Age of Onset   Hypertension Mother    Arthritis Mother    Hypertension Father    Arthritis Father    Diabetes Father    Breast cancer Neg Hx     Social History   Socioeconomic History   Marital status: Single    Spouse name: Not on file   Number of children: Not on file   Years of education: Not on file   Highest education level: Not on file  Occupational History   Not on file  Tobacco Use   Smoking status: Never   Smokeless tobacco: Never  Vaping Use   Vaping Use: Never used  Substance and Sexual Activity   Alcohol use: Yes    Comment: occ   Drug use: No   Sexual activity: Not Currently    Birth control/protection: Pill  Other Topics Concern   Not on file  Social History Narrative   Not on file   Social Determinants of Health    Financial Resource Strain: Not on file  Food Insecurity: Not on file  Transportation Needs: Not on file  Physical Activity: Not on file  Stress: Not on file  Social Connections: Not on file  Intimate Partner Violence: Not on file    Current Outpatient Medications on File Prior to Visit  Medication Sig Dispense Refill   lisinopril-hydrochlorothiazide (ZESTORETIC) 10-12.5 MG tablet Take 1 tablet by mouth as needed. Take for BP>130 (Patient taking differently: Take 0.5 tablets by mouth as needed. Take for BP>130) 60 tablet 3   meloxicam (MOBIC) 15 MG tablet Take 15 mg by mouth daily as needed.     norethindrone (AYGESTIN) 5 MG tablet Take 1 tablet (5 mg total) by mouth daily. 30 tablet 2   No current facility-administered medications on file prior to visit.   No Known Allergies    Review of Systems Constitutional: No recent fever/chills/sweats Respiratory: No recent cough/bronchitis Cardiovascular: No chest pain Gastrointestinal: No recent nausea/vomiting/diarrhea Genitourinary: No UTI symptoms Hematologic/lymphatic:No history of coagulopathy or recent blood thinner use  Objective:   Blood pressure 132/88, pulse 68, height 4\' 11"  (1.499 m), weight 231 lb 9.6 oz (105.1 kg). CONSTITUTIONAL: Well-developed, well-nourished female in no acute distress.  HENT:  Normocephalic, atraumatic, External right and left ear normal. Oropharynx is clear and moist EYES: Conjunctivae and EOM are normal. Pupils are equal, round, and reactive to light. No scleral icterus.  NECK: Normal range of motion, supple, no masses SKIN: Skin is warm and dry. No rash noted. Not diaphoretic. No erythema. No pallor. NEUROLOGIC: Alert and oriented to person, place, and time. Normal reflexes, muscle tone coordination. No cranial nerve deficit noted. PSYCHIATRIC: Normal mood and affect. Normal behavior. Normal judgment and thought content. CARDIOVASCULAR: Normal heart rate noted, regular rhythm RESPIRATORY:  Effort and breath sounds normal, no problems with respiration noted ABDOMEN: Soft, nontender, nondistended. PELVIC: Deferred MUSCULOSKELETAL: Normal range of motion. No edema and no tenderness. 2+ distal pulses.    Labs: Results for orders placed or performed during the hospital encounter of 07/20/21 (from the past 336 hour(s))  Potassium   Collection Time: 07/20/21  1:09 PM  Result Value Ref Range   Potassium 3.7 3.5 - 5.1 mmol/L  CBC   Collection Time: 07/20/21  1:09 PM  Result Value Ref Range   WBC 5.8 4.0 - 10.5 K/uL   RBC 4.97 3.87 - 5.11 MIL/uL   Hemoglobin 14.1 12.0 - 15.0 g/dL   HCT 43.8 36.0 - 46.0 %   MCV 88.1 80.0 - 100.0 fL   MCH 28.4 26.0 - 34.0 pg   MCHC 32.2 30.0 - 36.0 g/dL   RDW 15.8 (H) 11.5 - 15.5 %   Platelets 328 150 - 400 K/uL   nRBC 0.0 0.0 - 0.2 %    Pathology:  Endometrial biopsy pending  Imaging Studies (performed at [facility]):  Procedure Note   Olinger, Serita Sheller, MD - 05/10/2021  Formatting of this note might be different from the original.  EXAM: [place] ENDOVAGINAL (NON-OB)  DATE: 05/10/2021 12:10 PM  ACCESSION: [id number]  DICTATED: 05/10/2021 12:10 PM  INTERPRETATION LOCATION: [place]   CLINICAL INDICATION: 60 years old Female with irregular menses  - N92.6 - Irregular menses     TECHNIQUE: Ultrasound views of the pelvis were obtained endovaginally and transabdominally using gray scale and color Doppler imaging.   COMPARISON: No priors available   FINDINGS:   UTERUS/CERVIX: The uterus is slightly enlarged with heterogeneous echogenicity. Right-sided 2.2 x 1.7 x 2.4 cm pedunculated fibroid located along the anterior fundus. Additional 2.5 x 2.3 x 2.8 cm intramural fibroid at the left mid uterine body. The endometrium was normal in thickness for a premenopausal patient. Cystic focus at the anterior mid endometrium/ near the junctional zone measuring up to 86mm.   Multiple cervical nabothian cysts.   OVARIES: The ovaries were  seen not visualized transvaginally or transabdominally.   OTHER: No abnormal pelvic free fluid.   Please see below for data measurements:  LMP: 04/09/21  Uterus: Sagittal 9.8 cm; AP cm; Transverse cm  Endometrium: 1.00 (cm) cm   IMPRESSION:   --Enlarged fibroid uterus, as above.  --Small cystic focus along the anterior endometrium adjacent to the the junctional zone in the mid uterine body, nonspecific but which can be seen with adenomyosis. Otherwise unremarkable appearance of the endometrium measuring up to 1.0 cm in this premenopausal patient.  --The ovaries were not visualized transvaginally or transabdominally.  Assessment:    1. DUB (dysfunctional uterine bleeding)   2. Uterine cyst   3. Intramural and  subserous leiomyoma of uterus       Plan:   Counseling: Procedure, risks, reasons, benefits and complications (including injury to bowel, bladder, major blood vessel, ureter, bleeding, possibility of transfusion, infection, or fistula formation) reviewed in detail. Likelihood of success in alleviating the patient's condition was discussed. Routine postoperative instructions will be reviewed with the patient and her family in detail after surgery.  The patient concurred with the proposed plan, giving informed written consent for the surgery.   Preop testing reviewed. Instructions reviewed, including NPO after midnight.      Rubie Maid, MD [facility]

## 2021-07-21 ENCOUNTER — Encounter: Payer: Self-pay | Admitting: Obstetrics and Gynecology

## 2021-07-23 MED ORDER — LACTATED RINGERS IV SOLN: INTRAVENOUS | Status: DC

## 2021-07-23 MED ORDER — FAMOTIDINE 20 MG PO TABS: 20.0000 mg | ORAL_TABLET | Freq: Once | ORAL | Status: AC

## 2021-07-23 MED ORDER — ORAL CARE MOUTH RINSE: 15.0000 mL | Freq: Once | OROMUCOSAL | Status: AC

## 2021-07-23 MED ORDER — ACETAMINOPHEN 500 MG PO TABS: 1000.0000 mg | ORAL_TABLET | ORAL | Status: AC

## 2021-07-23 MED ORDER — CHLORHEXIDINE GLUCONATE 0.12 % MT SOLN: 15.0000 mL | Freq: Once | OROMUCOSAL | Status: AC

## 2021-07-24 ENCOUNTER — Other Ambulatory Visit: Payer: Self-pay

## 2021-07-24 ENCOUNTER — Ambulatory Visit: Payer: Medicaid Other | Admitting: Urgent Care

## 2021-07-24 ENCOUNTER — Ambulatory Visit: Payer: Medicaid Other | Admitting: Certified Registered Nurse Anesthetist

## 2021-07-24 ENCOUNTER — Encounter: Payer: Self-pay | Admitting: Obstetrics and Gynecology

## 2021-07-24 ENCOUNTER — Ambulatory Visit
Admission: RE | Admit: 2021-07-24 | Discharge: 2021-07-24 | Disposition: A | Discharge status: Home / Self Care | Payer: Medicaid Other | Attending: Obstetrics and Gynecology | Admitting: Obstetrics and Gynecology

## 2021-07-24 ENCOUNTER — Encounter
Admission: RE | Disposition: A | Discharge status: Home / Self Care | Payer: Self-pay | Source: Home / Self Care | Attending: Obstetrics and Gynecology

## 2021-07-24 DIAGNOSIS — N938 Other specified abnormal uterine and vaginal bleeding: Secondary | ICD-10-CM | POA: Diagnosis present

## 2021-07-24 DIAGNOSIS — N939 Abnormal uterine and vaginal bleeding, unspecified: Secondary | ICD-10-CM | POA: Diagnosis not present

## 2021-07-24 DIAGNOSIS — D252 Subserosal leiomyoma of uterus: Secondary | ICD-10-CM | POA: Insufficient documentation

## 2021-07-24 DIAGNOSIS — Z9889 Other specified postprocedural states: Secondary | ICD-10-CM

## 2021-07-24 DIAGNOSIS — D259 Leiomyoma of uterus, unspecified: Secondary | ICD-10-CM | POA: Diagnosis not present

## 2021-07-24 HISTORY — PX: DILATATION AND CURETTAGE/HYSTEROSCOPY WITH MINERVA: SHX6851

## 2021-07-24 LAB — POCT PREGNANCY, URINE: Preg Test, Ur: NEGATIVE

## 2021-07-24 LAB — SURGICAL PATHOLOGY

## 2021-07-24 SURGERY — DILATATION AND CURETTAGE/HYSTEROSCOPY WITH MINERVA
Anesthesia: General | Site: Uterus

## 2021-07-24 MED ORDER — ONDANSETRON HCL 4 MG/2ML IJ SOLN
INTRAMUSCULAR | Status: DC | PRN
  Administered 2021-07-24: 4 mg via INTRAVENOUS

## 2021-07-24 MED ORDER — MIDAZOLAM HCL 2 MG/2ML IJ SOLN
INTRAMUSCULAR | Status: AC
  Filled 2021-07-24: qty 2

## 2021-07-24 MED ORDER — FENTANYL CITRATE (PF) 100 MCG/2ML IJ SOLN
INTRAMUSCULAR | Status: AC
  Filled 2021-07-24: qty 2

## 2021-07-24 MED ORDER — ACETAMINOPHEN 500 MG PO TABS
ORAL_TABLET | ORAL | Status: AC
  Administered 2021-07-24: 1000 mg via ORAL
  Filled 2021-07-24: qty 2

## 2021-07-24 MED ORDER — LIDOCAINE HCL (PF) 2 % IJ SOLN
INTRAMUSCULAR | Status: AC
  Filled 2021-07-24: qty 5

## 2021-07-24 MED ORDER — MIDAZOLAM HCL 2 MG/2ML IJ SOLN
INTRAMUSCULAR | Status: DC | PRN
  Administered 2021-07-24 (×2): 1 mg via INTRAVENOUS

## 2021-07-24 MED ORDER — DEXAMETHASONE SODIUM PHOSPHATE 10 MG/ML IJ SOLN
INTRAMUSCULAR | Status: AC
  Filled 2021-07-24: qty 1

## 2021-07-24 MED ORDER — CHLORHEXIDINE GLUCONATE 0.12 % MT SOLN
OROMUCOSAL | Status: AC
  Administered 2021-07-24: 15 mL via OROMUCOSAL
  Filled 2021-07-24: qty 15

## 2021-07-24 MED ORDER — FENTANYL CITRATE (PF) 100 MCG/2ML IJ SOLN
25.0000 ug | INTRAMUSCULAR | Status: DC | PRN
  Administered 2021-07-24: 50 ug via INTRAVENOUS

## 2021-07-24 MED ORDER — FENTANYL CITRATE (PF) 100 MCG/2ML IJ SOLN
INTRAMUSCULAR | Status: DC | PRN
  Administered 2021-07-24: 25 ug via INTRAVENOUS
  Administered 2021-07-24: 50 ug via INTRAVENOUS
  Administered 2021-07-24: 25 ug via INTRAVENOUS

## 2021-07-24 MED ORDER — PROPOFOL 10 MG/ML IV BOLUS
INTRAVENOUS | Status: AC
  Filled 2021-07-24: qty 20

## 2021-07-24 MED ORDER — SODIUM CHLORIDE FLUSH 0.9 % IV SOLN
INTRAVENOUS | Status: AC
  Filled 2021-07-24: qty 10

## 2021-07-24 MED ORDER — OXYCODONE HCL 5 MG PO TABS: 5.0000 mg | ORAL_TABLET | Freq: Once | ORAL | Status: DC | PRN

## 2021-07-24 MED ORDER — FAMOTIDINE 20 MG PO TABS
ORAL_TABLET | ORAL | Status: AC
  Administered 2021-07-24: 20 mg via ORAL
  Filled 2021-07-24: qty 1

## 2021-07-24 MED ORDER — 0.9 % SODIUM CHLORIDE (POUR BTL) OPTIME
TOPICAL | Status: DC | PRN
  Administered 2021-07-24: 100 mL

## 2021-07-24 MED ORDER — LIDOCAINE HCL (CARDIAC) PF 100 MG/5ML IV SOSY
PREFILLED_SYRINGE | INTRAVENOUS | Status: DC | PRN
  Administered 2021-07-24: 100 mg via INTRAVENOUS

## 2021-07-24 MED ORDER — OXYCODONE HCL 5 MG/5ML PO SOLN: 5.0000 mg | Freq: Once | ORAL | Status: DC | PRN

## 2021-07-24 MED ORDER — DEXAMETHASONE SODIUM PHOSPHATE 10 MG/ML IJ SOLN
INTRAMUSCULAR | Status: DC | PRN
  Administered 2021-07-24: 10 mg via INTRAVENOUS

## 2021-07-24 MED ORDER — PHENYLEPHRINE HCL (PRESSORS) 10 MG/ML IV SOLN
INTRAVENOUS | Status: DC | PRN
  Administered 2021-07-24 (×2): 160 ug via INTRAVENOUS
  Administered 2021-07-24: 80 ug via INTRAVENOUS

## 2021-07-24 MED ORDER — PROPOFOL 10 MG/ML IV BOLUS
INTRAVENOUS | Status: DC | PRN
  Administered 2021-07-24: 200 mg via INTRAVENOUS

## 2021-07-24 MED ORDER — ONDANSETRON HCL 4 MG/2ML IJ SOLN
INTRAMUSCULAR | Status: AC
  Filled 2021-07-24: qty 2

## 2021-07-24 SURGICAL SUPPLY — 23 items
BAG INFUSER PRESSURE 100CC (MISCELLANEOUS) ×1 IMPLANT
CANISTER SUC SOCK COL 7IN (MISCELLANEOUS) ×2 IMPLANT
DEVICE MYOSURE LITE (MISCELLANEOUS) IMPLANT
DRSG TELFA 3X8 NADH (GAUZE/BANDAGES/DRESSINGS) IMPLANT
ELECT REM PT RETURN 9FT ADLT (ELECTROSURGICAL) ×2
ELECTRODE REM PT RTRN 9FT ADLT (ELECTROSURGICAL) ×1 IMPLANT
GAUZE 4X4 16PLY ~~LOC~~+RFID DBL (SPONGE) ×2 IMPLANT
GLOVE SURG ENC MOIS LTX SZ6.5 (GLOVE) ×2 IMPLANT
GOWN STRL REUS W/ TWL LRG LVL3 (GOWN DISPOSABLE) ×2 IMPLANT
GOWN STRL REUS W/TWL LRG LVL3 (GOWN DISPOSABLE) ×4
IV NS IRRIG 3000ML ARTHROMATIC (IV SOLUTION) ×2 IMPLANT
KIT PROCEDURE FLUENT (KITS) IMPLANT
KIT TURNOVER CYSTO (KITS) ×2 IMPLANT
MANIFOLD NEPTUNE II (INSTRUMENTS) ×2 IMPLANT
PACK DNC HYST (MISCELLANEOUS) ×2 IMPLANT
PAD DRESSING TELFA 3X8 NADH (GAUZE/BANDAGES/DRESSINGS) IMPLANT
PAD OB MATERNITY 4.3X12.25 (PERSONAL CARE ITEMS) ×2 IMPLANT
PAD PREP 24X41 OB/GYN DISP (PERSONAL CARE ITEMS) ×2 IMPLANT
SCRUB EXIDINE 4% CHG 4OZ (MISCELLANEOUS) ×2 IMPLANT
SEAL ROD LENS SCOPE MYOSURE (ABLATOR) ×2 IMPLANT
SET CYSTO W/LG BORE CLAMP LF (SET/KITS/TRAYS/PACK) IMPLANT
TUBING CONNECTING 10 (TUBING) IMPLANT
WATER STERILE IRR 500ML POUR (IV SOLUTION) ×2 IMPLANT

## 2021-07-24 NOTE — Anesthesia Procedure Notes (Signed)
Procedure Name: LMA Insertion Date/Time: 07/24/2021 2:29 PM Performed by: Johnna Acosta, CRNA Pre-anesthesia Checklist: Patient identified, Emergency Drugs available, Suction available, Patient being monitored and Timeout performed Patient Re-evaluated:Patient Re-evaluated prior to induction Oxygen Delivery Method: Circle system utilized Preoxygenation: Pre-oxygenation with 100% oxygen Induction Type: IV induction LMA: LMA inserted LMA Size: 4.5 Tube type: Oral Number of attempts: 1 Placement Confirmation: positive ETCO2 Tube secured with: Tape Dental Injury: Teeth and Oropharynx as per pre-operative assessment

## 2021-07-24 NOTE — Transfer of Care (Signed)
Immediate Anesthesia Transfer of Care Note  Patient: Leah Stephens  Procedure(s) Performed: DILATATION AND CURETTAGE/HYSTEROSCOPY WITH MINERVA  Patient Location: PACU  Anesthesia Type:General  Level of Consciousness: awake and drowsy  Airway & Oxygen Therapy: Patient Spontanous Breathing and Patient connected to face mask oxygen  Post-op Assessment: Report given to RN and Post -op Vital signs reviewed and stable  Post vital signs: Reviewed and stable  Last Vitals:  Vitals Value Taken Time  BP 107/92 07/24/21 1527  Temp    Pulse 80 07/24/21 1527  Resp 21 07/24/21 1527  SpO2 100 % 07/24/21 1527    Last Pain:  Vitals:   07/24/21 1219  TempSrc: Oral  PainSc: 0-No pain         Complications: No notable events documented.

## 2021-07-24 NOTE — Anesthesia Preprocedure Evaluation (Signed)
Anesthesia Evaluation  Patient identified by MRN, date of birth, ID band Patient awake    Reviewed: Allergy & Precautions, NPO status , Patient's Chart, lab work & pertinent test results  History of Anesthesia Complications Negative for: history of anesthetic complications  Airway Mallampati: III  TM Distance: >3 FB Neck ROM: full    Dental  (+) Chipped, Poor Dentition, Missing   Pulmonary neg pulmonary ROS, neg shortness of breath,    Pulmonary exam normal        Cardiovascular Exercise Tolerance: Good hypertension, (-) angina(-) Past MI and (-) DOE Normal cardiovascular exam     Neuro/Psych negative neurological ROS  negative psych ROS   GI/Hepatic negative GI ROS, Neg liver ROS, neg GERD  ,  Endo/Other  negative endocrine ROS  Renal/GU      Musculoskeletal  (+) Arthritis ,   Abdominal   Peds  Hematology negative hematology ROS (+)   Anesthesia Other Findings Past Medical History: No date: Arthritis No date: Hypertension No date: Obesity  Past Surgical History: No date: CESAREAN SECTION  BMI    Body Mass Index: 46.66 kg/m      Reproductive/Obstetrics negative OB ROS                             Anesthesia Physical Anesthesia Plan  ASA: 3  Anesthesia Plan: General LMA   Post-op Pain Management:    Induction: Intravenous  PONV Risk Score and Plan: Dexamethasone, Ondansetron, Midazolam and Treatment may vary due to age or medical condition  Airway Management Planned: LMA  Additional Equipment:   Intra-op Plan:   Post-operative Plan: Extubation in OR  Informed Consent: I have reviewed the patients History and Physical, chart, labs and discussed the procedure including the risks, benefits and alternatives for the proposed anesthesia with the patient or authorized representative who has indicated his/her understanding and acceptance.     Dental Advisory  Given  Plan Discussed with: Anesthesiologist, CRNA and Surgeon  Anesthesia Plan Comments: (Patient consented for risks of anesthesia including but not limited to:  - adverse reactions to medications - damage to eyes, teeth, lips or other oral mucosa - nerve damage due to positioning  - sore throat or hoarseness - Damage to heart, brain, nerves, lungs, other parts of body or loss of life  Patient voiced understanding.)        Anesthesia Quick Evaluation

## 2021-07-24 NOTE — Interval H&P Note (Signed)
History and Physical Interval Note:  07/24/2021 1:47 PM  Leah Stephens  has presented today for surgery, with the diagnosis of dysfunctional uterine bleeding, Fibroid Uterus.  The various methods of treatment have been discussed with the patient and family. After consideration of risks, benefits and other options for treatment, the patient has consented to  Procedure(s): DILATATION AND CURETTAGE/HYSTEROSCOPY WITH MINERVA (N/A) as a surgical intervention.  The patient's history has been reviewed, patient examined, no change in status, stable for surgery.  I have reviewed the patient's chart and labs.  Questions were answered to the patient's satisfaction.     Rubie Maid, MD [facility]

## 2021-07-24 NOTE — Op Note (Signed)
Procedure(s): DILATATION AND CURETTAGE/HYSTEROSCOPY WITH MINERVA Procedure Note  Leah Stephens female 60 y.o. 07/24/2021  Indications: The patient is a 60 y.o. G74P0 female with dysfunctional uterine bleeding, likely perimenopausal.  Fibroid uterus (subserosal).  Pre-operative Diagnosis: Dysfunctional uterine bleeding (likely perimenopausal). Fibroid uterus (subserosal)  Post-operative Diagnosis: Same  Surgeon: Rubie Maid, MD  Assistants:  None.   Anesthesia: General endotracheal anesthesia  Procedure Details: The patient was seen in the Holding Room. The risks, benefits, complications, treatment options, and expected outcomes were discussed with the patient.  The patient concurred with the proposed plan, giving informed consent.  The site of surgery properly noted/marked. The patient was taken to the Operating Room, identified as [name] and the procedure verified as Procedure(s) (LRB): DILATATION AND CURETTAGE/HYSTEROSCOPY WITH MINERVA (N/A).   The patient was then placed under general anesthesia without difficulty.  She was then prepped and draped in the normal sterile fashion, and placed in the dorsal lithotomy position.  A time out was performed.  An exam under anesthesia was performed with the findings noted above.  Straight catheterization was performed. A sterile speculum was inserted into vagina. A single-tooth tenaculum was used to grasp the anterior lip of the cervix. Cervical dilation was performed. A 5 mm hysteroscope was introduced into the uterus under direct visualization. The cavity was allowed to fill, and then the entire cavity was explored with the findings described above. The hysteroscope was removed, and a sharp curette was then passed into the uterus and endometrial sampling was collected for pathology.   Next the Minerva instrument was primed per instructions. The instrument was then placed into the endometrial canal and activated.  The Minerva instrument was  then removed from the uterine cavity.  The hysteroscope was then re-introduced for final survey, with adequate charring of the endometrium noted.  The hysteroscope was removed from the patient's uterine cavity. The tenaculum was removed and excellent hemostasis was noted. The speculum was removed from the vagina.   All instrument and sponge counts were correct at the end of the procedure x 2.  The patient tolerated the procedure well.  She was awakened and taken to the PACU in stable condition.   Findings: Uterus sounded to 10 cm.  Cervical length 4 cm.  Proliferative endometrium.  Tubal ostia were not visualized bilaterally.  No intrauterine masses.  Adequate charring of endometrial tissue post ablation.  No perforations noted.   Estimated Blood Loss:  5 ml      Drains: straight catheterization with 100 cc at start of procedure.          Total IV Fluids:  200 ml  Specimens:  Endometrial curettings         Implants: None         Complications:  None; patient tolerated the procedure well.         Disposition: PACU - hemodynamically stable.         Condition: stable   Rubie Maid, MD [facility]

## 2021-07-24 NOTE — Discharge Instructions (Signed)

## 2021-07-25 ENCOUNTER — Encounter: Payer: Self-pay | Admitting: Obstetrics and Gynecology

## 2021-07-25 NOTE — Anesthesia Postprocedure Evaluation (Signed)
Anesthesia Post Note  Patient: Leah Stephens  Procedure(s) Performed: DILATATION AND CURETTAGE/HYSTEROSCOPY WITH MINERVA ABLATION (Uterus)  Patient location during evaluation: PACU Anesthesia Type: General Level of consciousness: awake and alert Pain management: pain level controlled Vital Signs Assessment: post-procedure vital signs reviewed and stable Respiratory status: spontaneous breathing, nonlabored ventilation, respiratory function stable and patient connected to nasal cannula oxygen Cardiovascular status: blood pressure returned to baseline and stable Postop Assessment: no apparent nausea or vomiting Anesthetic complications: no   No notable events documented.   Last Vitals:  Vitals:   07/24/21 1600 07/24/21 1615  BP: 128/84 (!) 112/98  Pulse: 72 74  Resp: 18 15  Temp:  (!) 36.2 C  SpO2: 96% 96%    Last Pain:  Vitals:   07/24/21 1615  TempSrc: Temporal  PainSc: [place]

## 2021-07-26 LAB — SURGICAL PATHOLOGY

## 2021-07-28 ENCOUNTER — Telehealth: Payer: Self-pay | Admitting: Obstetrics and Gynecology

## 2021-07-28 NOTE — Telephone Encounter (Signed)
Patient called.  Patient aware.  

## 2021-07-28 NOTE — Telephone Encounter (Signed)
She can resume her Aygestin until she come sin for her appointment next week.

## 2021-07-28 NOTE — Telephone Encounter (Signed)
Pt states ablation sx Monday - pt states that's he started bleeding and pain Tuesday night. Pt is concerned seems like bleeding is getting worse. Manageable pain in lower back. Please Advise.

## 2021-08-02 ENCOUNTER — Encounter: Payer: Self-pay | Admitting: Obstetrics and Gynecology

## 2021-08-02 ENCOUNTER — Ambulatory Visit (INDEPENDENT_AMBULATORY_CARE_PROVIDER_SITE_OTHER): Payer: Medicaid Other | Admitting: Obstetrics and Gynecology

## 2021-08-02 ENCOUNTER — Other Ambulatory Visit: Payer: Self-pay

## 2021-08-02 VITALS — BP 142/80 | HR 74 | Resp 16 | Ht 59.0 in | Wt 232.1 lb

## 2021-08-02 DIAGNOSIS — Z9889 Other specified postprocedural states: Secondary | ICD-10-CM

## 2021-08-02 DIAGNOSIS — Z4889 Encounter for other specified surgical aftercare: Secondary | ICD-10-CM

## 2021-08-02 DIAGNOSIS — N938 Other specified abnormal uterine and vaginal bleeding: Secondary | ICD-10-CM

## 2021-08-02 NOTE — Progress Notes (Signed)
° ° °  OBSTETRICS/GYNECOLOGY POST-OPERATIVE CLINIC VISIT  Subjective:     Leah Stephens is a 60 y.o. female who presents to the clinic 1 weeks status post Hysteroscopy Dilation and Curettage, Endometrial Ablation ([place] for abnormal uterine bleeding, fibroids, and possible focal adenomyosis. . Eating a regular diet without difficulty. Bowel movements are normal. Pain is controlled with current analgesics. Medications being used: acetaminophen.  Notes that 3-4 days after her surgery she began to have heavier bleeding.  Was restarted on her Aygestin.   The following portions of the patient's history were reviewed and updated as appropriate: allergies, current medications, past family history, past medical history, past social history, past surgical history, and problem list.  Review of Systems Pertinent items noted in HPI and remainder of comprehensive ROS otherwise negative.   Objective:   BP (!) 142/80    Pulse 74    Resp 16    Ht 4\' 11"  (1.499 m)    Wt 232 lb 1.6 oz (105.3 kg)    BMI 46.88 kg/m    General:  alert and no distress  Abdomen: soft, bowel sounds active, non-tender  Incision:   None    Pathology:   A. ENDOMETRIAL CURETTINGS:  - POLYPOID FRAGMENTS OF ENDOMETRIUM WITH DECIDUALIZED PATTERN OF  PROGESTIN EFFECT.  - NEGATIVE FOR ATYPIA/EIN AND MALIGNANCY.    Assessment:   Patient s/p Hysteroscopy Dilation and Curettage, Endometrial Ablation ([place] (surgery)  Doing well postoperatively.   Plan:   1. Continue any current medications as instructed by provider. Advised to continue her Aygestin for ~ 1 month then can discontinue.  2. Wound care discussed. 3. Operative findings again reviewed. Pathology report discussed. 4. Activity restrictions:  pelvic rest x 1 week.  5. Anticipated return to work: not applicable. 6. Follow up:  As needed.    Rubie Maid, MD [facility]

## 2021-08-23 ENCOUNTER — Telehealth: Payer: Self-pay | Admitting: Obstetrics and Gynecology

## 2021-08-23 NOTE — Telephone Encounter (Signed)
Please find out if patient is still taking her Aygestin. If she is not, then she needs to resume it.  If she is, then she can take 5 mg BID instead of once daily until the bleeding stops, then continue with once a day.

## 2021-08-23 NOTE — Telephone Encounter (Signed)
Pt called stating that she had an ablation on 12-12 and she had spotting after sx but for the last 3 days has been bleeding heavy-she is having to change panty liner every three hours and is going to start using pad, she feel weak and like this is worsening. How do you advise?

## 2021-08-24 ENCOUNTER — Encounter: Payer: Self-pay | Admitting: Obstetrics and Gynecology

## 2021-08-25 ENCOUNTER — Encounter: Payer: Self-pay | Admitting: Obstetrics and Gynecology

## 2021-08-25 NOTE — Telephone Encounter (Signed)
Spoke with pt who states that she is taking the medication, she has questions and wants provider to know she is still bleeding with clots now. I made CMA aware.

## 2021-08-28 NOTE — Telephone Encounter (Signed)
Please see if patient can come in to see me sometime this week.

## 2021-08-29 NOTE — Telephone Encounter (Signed)
Pt is schedule  1/18 @ 9:30

## 2021-08-30 ENCOUNTER — Ambulatory Visit (INDEPENDENT_AMBULATORY_CARE_PROVIDER_SITE_OTHER): Payer: Medicaid Other | Admitting: Obstetrics and Gynecology

## 2021-08-30 ENCOUNTER — Encounter: Payer: Self-pay | Admitting: Obstetrics and Gynecology

## 2021-08-30 ENCOUNTER — Other Ambulatory Visit: Payer: Self-pay

## 2021-08-30 VITALS — BP 116/80 | HR 74 | Ht 59.0 in | Wt 227.0 lb

## 2021-08-30 DIAGNOSIS — N939 Abnormal uterine and vaginal bleeding, unspecified: Secondary | ICD-10-CM | POA: Diagnosis not present

## 2021-08-30 DIAGNOSIS — Z9889 Other specified postprocedural states: Secondary | ICD-10-CM

## 2021-08-30 MED ORDER — DOXYCYCLINE HYCLATE 100 MG PO CAPS: 100.0000 mg | ORAL_CAPSULE | Freq: Two times a day (BID) | ORAL | 0 refills | Status: AC

## 2021-08-30 NOTE — Progress Notes (Signed)
° ° °  GYNECOLOGY PROGRESS NOTE  Subjective:    Patient ID: Leah Stephens, female    DOB: 1961-07-20, 61 y.o.   MRN: [id number]  HPI  Patient is a 61 y.o. G5P0 female who presents for persistent abnormal vaginal bleeding s/p endometrial ablation on 07/24/2021.  Initially noted that the bleeding had gotten lighter, but ~ 2 weeks ago she began bleeding again.  Restarted her Aygestin at that time, however is still bleeding, and got heavier these past few days, however is still able to use a pantyliner.   The following portions of the patient's history were reviewed and updated as appropriate: allergies, current medications, past family history, past medical history, past social history, past surgical history, and problem list.  Review of Systems Pertinent items are noted in HPI.   Objective:   Blood pressure 116/80, pulse 74, height 4\' 11"  (1.499 m), weight 227 lb (103 kg), SpO2 96 %.  Body mass index is 45.85 kg/m. General appearance: alert and no distress Abdomen: soft, non-tender; bowel sounds normal; no masses,  no organomegaly Pelvic: external genitalia normal, rectovaginal septum normal.  Vagina with scant dark red blood.  Cervix normal appearing, no lesions and no motion tenderness.  Uterus mobile, nontender, normal shape and size.  Adnexae non-palpable, nontender bilaterally.  Extremities: extremities normal, atraumatic, no cyanosis or edema Neurologic: Grossly normal   Assessment:   1. Abnormal uterine bleeding (AUB)   2. S/P endometrial ablation     Plan:   Unclear cause for persistent bleeding.  Has h/o obesity, but has had negative biopsies thus far. Has failed IUD, Aygestin alone, and now still with some bleeding after endometrial ablation.  Will treat with PO doxycycline to rule out endometritis as a cause, and have patient use NSAIDs for the next 5-7 days.   If bleeding persists or becomes heavier again, I discussed that next steps would be for definitive management with  hysterectomy.  Is currently taking Aygestin 5 mg daily, can increase to 10 mg in case of heavy bleeding in the interim.    Rubie Maid, MD [facility]

## 2021-10-06 ENCOUNTER — Other Ambulatory Visit: Payer: Self-pay | Admitting: Student

## 2021-10-06 DIAGNOSIS — Z1231 Encounter for screening mammogram for malignant neoplasm of breast: Secondary | ICD-10-CM

## 2021-11-13 ENCOUNTER — Ambulatory Visit: Payer: Medicaid Other

## 2021-12-18 ENCOUNTER — Ambulatory Visit
Admission: RE | Admit: 2021-12-18 | Discharge: 2021-12-18 | Disposition: A | Discharge status: Home / Self Care | Payer: Medicaid Other | Source: Ambulatory Visit | Attending: Student | Admitting: Student

## 2021-12-18 ENCOUNTER — Encounter: Payer: Self-pay | Admitting: Radiology

## 2021-12-18 DIAGNOSIS — Z1231 Encounter for screening mammogram for malignant neoplasm of breast: Secondary | ICD-10-CM

## 2022-04-03 ENCOUNTER — Other Ambulatory Visit (INDEPENDENT_AMBULATORY_CARE_PROVIDER_SITE_OTHER): Payer: Self-pay | Admitting: Nurse Practitioner

## 2022-04-03 DIAGNOSIS — I83812 Varicose veins of left lower extremities with pain: Secondary | ICD-10-CM

## 2022-04-04 ENCOUNTER — Encounter (INDEPENDENT_AMBULATORY_CARE_PROVIDER_SITE_OTHER): Payer: Self-pay | Admitting: Nurse Practitioner

## 2022-04-04 ENCOUNTER — Ambulatory Visit (INDEPENDENT_AMBULATORY_CARE_PROVIDER_SITE_OTHER): Payer: Medicaid Other | Admitting: Nurse Practitioner

## 2022-04-04 ENCOUNTER — Ambulatory Visit (INDEPENDENT_AMBULATORY_CARE_PROVIDER_SITE_OTHER): Payer: Medicaid Other

## 2022-04-04 VITALS — BP 138/84 | HR 60 | Resp 16 | Ht 59.0 in | Wt 217.8 lb

## 2022-04-04 DIAGNOSIS — I83812 Varicose veins of left lower extremities with pain: Secondary | ICD-10-CM | POA: Diagnosis not present

## 2022-04-04 DIAGNOSIS — R600 Localized edema: Secondary | ICD-10-CM

## 2022-04-04 DIAGNOSIS — R030 Elevated blood-pressure reading, without diagnosis of hypertension: Secondary | ICD-10-CM

## 2022-04-04 MED ORDER — TRAMADOL HCL 50 MG PO TABS: 50.0000 mg | ORAL_TABLET | Freq: Four times a day (QID) | ORAL | 0 refills | Status: AC | PRN

## 2022-04-19 ENCOUNTER — Other Ambulatory Visit (INDEPENDENT_AMBULATORY_CARE_PROVIDER_SITE_OTHER): Payer: Self-pay | Admitting: Nurse Practitioner

## 2022-04-19 DIAGNOSIS — I83812 Varicose veins of left lower extremities with pain: Secondary | ICD-10-CM

## 2022-04-19 DIAGNOSIS — S8012XS Contusion of left lower leg, sequela: Secondary | ICD-10-CM

## 2022-04-23 ENCOUNTER — Ambulatory Visit (INDEPENDENT_AMBULATORY_CARE_PROVIDER_SITE_OTHER): Payer: Medicaid Other | Admitting: Nurse Practitioner

## 2022-04-23 ENCOUNTER — Encounter (INDEPENDENT_AMBULATORY_CARE_PROVIDER_SITE_OTHER): Payer: Self-pay | Admitting: Nurse Practitioner

## 2022-04-23 ENCOUNTER — Ambulatory Visit (INDEPENDENT_AMBULATORY_CARE_PROVIDER_SITE_OTHER): Payer: Medicaid Other

## 2022-04-23 DIAGNOSIS — S8012XS Contusion of left lower leg, sequela: Secondary | ICD-10-CM | POA: Diagnosis not present

## 2022-04-23 NOTE — Progress Notes (Signed)
Subjective:    Patient ID: Leah Stephens, female    DOB: [birth year], 61 y.o.   MRN: [id number] Chief Complaint  Patient presents with   Follow-up    Ultrasound follow up    Leah Stephens is referred by Howard Pouch, NP in regards to pain in her upper outer left thigh for the last 2 weeks.  She denies new injuries or falls.  She notes some itching and burning and what she believes are new varicose veins.  She notes that the area is very tender today.  However it is improved from when she was previously seen.  On physical evaluation appears to be more bruising in the area.   His leg, noninvasive studies show hematoma 3.96 cm x 2.71 cm.  There is also a noted Baker's cyst measuring 2.22 cm x 1.22 cm.  Today no evidence of hematoma is noted.    Review of Systems  All other systems reviewed and are negative.      Objective:   Physical Exam Vitals reviewed.  HENT:     Head: Normocephalic.  Cardiovascular:     Rate and Rhythm: Normal rate.  Pulmonary:     Effort: Pulmonary effort is normal.  Musculoskeletal:        General: Tenderness present.  Skin:    General: Skin is warm and dry.  Neurological:     Mental Status: She is alert and oriented to person, place, and time.  Psychiatric:        Mood and Affect: Mood normal.        Behavior: Behavior normal.        Thought Content: Thought content normal.        Judgment: Judgment normal.     BP 106/70 (BP Location: Left Arm)   Pulse 73   Resp 16   Wt 214 lb 3.2 oz (97.2 kg)   BMI 43.26 kg/m   Past Medical History:  Diagnosis Date   Arthritis    Hypertension    Obesity     Social History   Socioeconomic History   Marital status: Single    Spouse name: Not on file   Number of children: Not on file   Years of education: Not on file   Highest education level: Not on file  Occupational History   Not on file  Tobacco Use   Smoking status: Never   Smokeless tobacco: Never  Vaping Use   Vaping Use: Never used   Substance and Sexual Activity   Alcohol use: Yes    Comment: occ   Drug use: No   Sexual activity: Not Currently    Birth control/protection: Pill  Other Topics Concern   Not on file  Social History Narrative   Not on file   Social Determinants of Health   Financial Resource Strain: Not on file  Food Insecurity: Not on file  Transportation Needs: Not on file  Physical Activity: Not on file  Stress: Not on file  Social Connections: Not on file  Intimate Partner Violence: Not on file    Past Surgical History:  Procedure Laterality Date   CESAREAN SECTION     DILATATION AND CURETTAGE/HYSTEROSCOPY WITH MINERVA N/A 07/24/2021   Procedure: DILATATION AND CURETTAGE/HYSTEROSCOPY WITH MINERVA ABLATION;  Surgeon: Rubie Maid, MD;  Location: [facility];  Service: Gynecology;  Laterality: N/A;    Family History  Problem Relation Age of Onset   Hypertension Mother    Arthritis Mother    Hypertension Father  Arthritis Father    Diabetes Father    Breast cancer Neg Hx     No Known Allergies     Latest Ref Rng & Units 07/20/2021    1:09 PM 06/16/2021    8:27 AM 01/19/2019    9:49 AM  CBC  WBC 4.0 - 10.5 K/uL 5.8  6.0  4.6   Hemoglobin 12.0 - 15.0 g/dL 14.1  13.7  14.5   Hematocrit 36.0 - 46.0 % 43.8  42.1  44.3   Platelets 150 - 400 K/uL 328  300  277       CMP     Component Value Date/Time   NA 140 06/16/2021 0827   NA 136 05/11/2013 1838   K 3.7 07/20/2021 1309   K 3.2 (L) 05/11/2013 1838   CL 108 06/16/2021 0827   CL 104 05/11/2013 1838   CO2 25 06/16/2021 0827   CO2 25 05/11/2013 1838   GLUCOSE 103 (H) 06/16/2021 0827   GLUCOSE 112 (H) 05/11/2013 1838   BUN 16 06/16/2021 0827   BUN 16 05/11/2013 1838   CREATININE 0.98 06/16/2021 0827   CREATININE 1.19 05/11/2013 1838   CALCIUM 9.2 06/16/2021 0827   CALCIUM 8.9 05/11/2013 1838   PROT 8.4 (H) 05/11/2013 1838   ALBUMIN 3.6 05/11/2013 1838   AST 146 (H) 05/11/2013 1838   ALT 409 (H) 05/11/2013 1838    ALKPHOS 76 05/11/2013 1838   BILITOT 0.5 05/11/2013 1838   GFRNONAA >60 06/16/2021 0827   GFRNONAA 53 (L) 05/11/2013 1838   GFRAA >60 05/03/2015 1610   GFRAA >60 05/11/2013 1838     No results found.     Assessment & Plan:   1. Hematoma of left lower extremity, sequela Today there is no evidence of hematoma.  However the patient still has extensively tender thigh area.  The pain has been improving however.  The patient will continue with warm compresses and use of over-the-counter pain medication.  We will have her return in 4 weeks.  We discussed the possibility of a CT scan to evaluate.  However at this time the patient does not quite yet wish to move forward and wants to see if this will resolve. - VAS [place] LOWER EXTREMITY VENOUS (DVT)     Current Outpatient Medications on File Prior to Visit  Medication Sig Dispense Refill   doxycycline (VIBRAMYCIN) 100 MG capsule Take 1 capsule (100 mg total) by mouth 2 (two) times daily. 14 capsule 0   meloxicam (MOBIC) 15 MG tablet Take 15 mg by mouth daily as needed.     traMADol (ULTRAM) 50 MG tablet Take 1 tablet (50 mg total) by mouth every 6 (six) hours as needed. 20 tablet 0   lisinopril-hydrochlorothiazide (ZESTORETIC) 10-12.5 MG tablet Take 1 tablet by mouth as needed. Take for BP>130 (Patient not taking: Reported on 04/23/2022) 60 tablet 3   ORTHO MICRONOR 0.35 MG tablet Take 1 tablet by mouth daily. (Patient not taking: Reported on 04/04/2022)     No current facility-administered medications on file prior to visit.    There are no Patient Instructions on file for this visit. No follow-ups on file.   Kris Hartmann, NP

## 2022-04-23 NOTE — Progress Notes (Signed)
Subjective:    Patient ID: Leah Stephens, female    DOB: 09/04/1960, 61 y.o.   MRN: [id number] Chief Complaint  Patient presents with   New Patient (Initial Visit)    Ref Nancie Neas consult left varicose vein with pain    Leah Stephens is referred by Howard Pouch, NP in regards to pain in her upper outer left thigh for the last 2 weeks.  She denies new injuries or falls.  She notes some itching and burning and what she believes are new varicose veins.  On physical evaluation appears to be more bruising in the area.  Today noninvasive studies show hematoma 3.96 cm x 2.71 cm.  There is also a noted Baker's cyst measuring 2.22 cm x 1.22 cm    Review of Systems  Hematological:  Bruises/bleeds easily.  All other systems reviewed and are negative.      Objective:   Physical Exam Vitals reviewed.  HENT:     Head: Normocephalic.  Cardiovascular:     Rate and Rhythm: Normal rate.     Pulses: Normal pulses.  Pulmonary:     Effort: Pulmonary effort is normal.  Skin:    General: Skin is warm and dry.  Neurological:     Mental Status: She is alert and oriented to person, place, and time.  Psychiatric:        Mood and Affect: Mood normal.        Behavior: Behavior normal.        Thought Content: Thought content normal.        Judgment: Judgment normal.     BP 138/84 (BP Location: Left Arm)   Pulse 60   Resp 16   Ht '4\' 11"'$  (1.499 m)   Wt 217 lb 12.8 oz (98.8 kg)   BMI 43.99 kg/m   Past Medical History:  Diagnosis Date   Arthritis    Hypertension    Obesity     Social History   Socioeconomic History   Marital status: Single    Spouse name: Not on file   Number of children: Not on file   Years of education: Not on file   Highest education level: Not on file  Occupational History   Not on file  Tobacco Use   Smoking status: Never   Smokeless tobacco: Never  Vaping Use   Vaping Use: Never used  Substance and Sexual Activity   Alcohol use: Yes    Comment:  occ   Drug use: No   Sexual activity: Not Currently    Birth control/protection: Pill  Other Topics Concern   Not on file  Social History Narrative   Not on file   Social Determinants of Health   Financial Resource Strain: Not on file  Food Insecurity: Not on file  Transportation Needs: Not on file  Physical Activity: Not on file  Stress: Not on file  Social Connections: Not on file  Intimate Partner Violence: Not on file    Past Surgical History:  Procedure Laterality Date   CESAREAN SECTION     DILATATION AND CURETTAGE/HYSTEROSCOPY WITH MINERVA N/A 07/24/2021   Procedure: DILATATION AND CURETTAGE/HYSTEROSCOPY WITH MINERVA ABLATION;  Surgeon: Rubie Maid, MD;  Location: [facility] OR[facility];  Service: Gynecology;  Laterality: N/A;    Family History  Problem Relation Age of Onset   Hypertension Mother    Arthritis Mother    Hypertension Father    Arthritis Father    Diabetes Father    Breast cancer Neg Hx  No Known Allergies     Latest Ref Rng & Units 07/20/2021    1:09 PM 06/16/2021    8:27 AM 01/19/2019    9:49 AM  CBC  WBC 4.0 - 10.5 K/uL 5.8  6.0  4.6   Hemoglobin 12.0 - 15.0 g/dL 14.1  13.7  14.5   Hematocrit 36.0 - 46.0 % 43.8  42.1  44.3   Platelets 150 - 400 K/uL 328  300  277       CMP     Component Value Date/Time   NA 140 06/16/2021 0827   NA 136 05/11/2013 1838   K 3.7 07/20/2021 1309   K 3.2 (L) 05/11/2013 1838   CL 108 06/16/2021 0827   CL 104 05/11/2013 1838   CO2 25 06/16/2021 0827   CO2 25 05/11/2013 1838   GLUCOSE 103 (H) 06/16/2021 0827   GLUCOSE 112 (H) 05/11/2013 1838   BUN 16 06/16/2021 0827   BUN 16 05/11/2013 1838   CREATININE 0.98 06/16/2021 0827   CREATININE 1.19 05/11/2013 1838   CALCIUM 9.2 06/16/2021 0827   CALCIUM 8.9 05/11/2013 1838   PROT 8.4 (H) 05/11/2013 1838   ALBUMIN 3.6 05/11/2013 1838   AST 146 (H) 05/11/2013 1838   ALT 409 (H) 05/11/2013 1838   ALKPHOS 76 05/11/2013 1838   BILITOT 0.5 05/11/2013 1838   GFRNONAA  >60 06/16/2021 0827   GFRNONAA 53 (L) 05/11/2013 1838   GFRAA >60 05/03/2015 1610   GFRAA >60 05/11/2013 1838     No results found.     Assessment & Plan:   1. Varicose veins of left lower extremity with pain Currently no evidence of varicose veins that are significant noted in the lower extremity - VAS [place] LOWER EXTREMITY VENOUS REFLUX  2. Edema leg Based upon the patient's location area of swelling I suspect it is the hematomas that we have located via ultrasound.  The patient has hematoma measuring approximately 3.96 x 2.71 cm.  There is also a Baker's cyst behind the left knee.  Patient is advised to use warm compresses over the area.  We will have the patient return in about 4 weeks to reevaluate size of hematoma.  We discussed with patient that hematoma typically takes time for resolution.  All surgical intervention is arterial option, and the wound healing is subsequently an extended process.  Based on this we will continue to try to resolve without intervention.  3. Elevated blood pressure reading Continue antihypertensive medications as already ordered, these medications have been reviewed and there are no changes at this time.    Current Outpatient Medications on File Prior to Visit  Medication Sig Dispense Refill   doxycycline (VIBRAMYCIN) 100 MG capsule Take 1 capsule (100 mg total) by mouth 2 (two) times daily. 14 capsule 0   lisinopril-hydrochlorothiazide (ZESTORETIC) 10-12.5 MG tablet Take 1 tablet by mouth as needed. Take for BP>130 (Patient taking differently: Take 0.5 tablets by mouth as needed. Take for BP>130) 60 tablet 3   meloxicam (MOBIC) 15 MG tablet Take 15 mg by mouth daily as needed.     ORTHO MICRONOR 0.35 MG tablet Take 1 tablet by mouth daily. (Patient not taking: Reported on 04/04/2022)     No current facility-administered medications on file prior to visit.    There are no Patient Instructions on file for this visit. No follow-ups on file.   Kris Hartmann, NP

## 2022-05-21 ENCOUNTER — Ambulatory Visit (INDEPENDENT_AMBULATORY_CARE_PROVIDER_SITE_OTHER): Payer: Medicaid Other | Admitting: Nurse Practitioner

## 2022-06-11 ENCOUNTER — Encounter (INDEPENDENT_AMBULATORY_CARE_PROVIDER_SITE_OTHER): Payer: Self-pay

## 2022-06-27 IMAGING — MG MM DIGITAL SCREENING BILAT W/ TOMO AND CAD
8 series · 8 of 24 positions shown · non-contrast
Comparison: Previous exam(s).

CLINICAL DATA: Screening.

EXAM:
DIGITAL SCREENING BILATERAL MAMMOGRAM WITH TOMOSYNTHESIS AND CAD
TECHNIQUE: Bilateral screening digital craniocaudal and mediolateral oblique
mammograms were obtained. Bilateral screening digital breast
tomosynthesis was performed. The images were evaluated with
computer-aided detection.

[R MLO synth-2D]
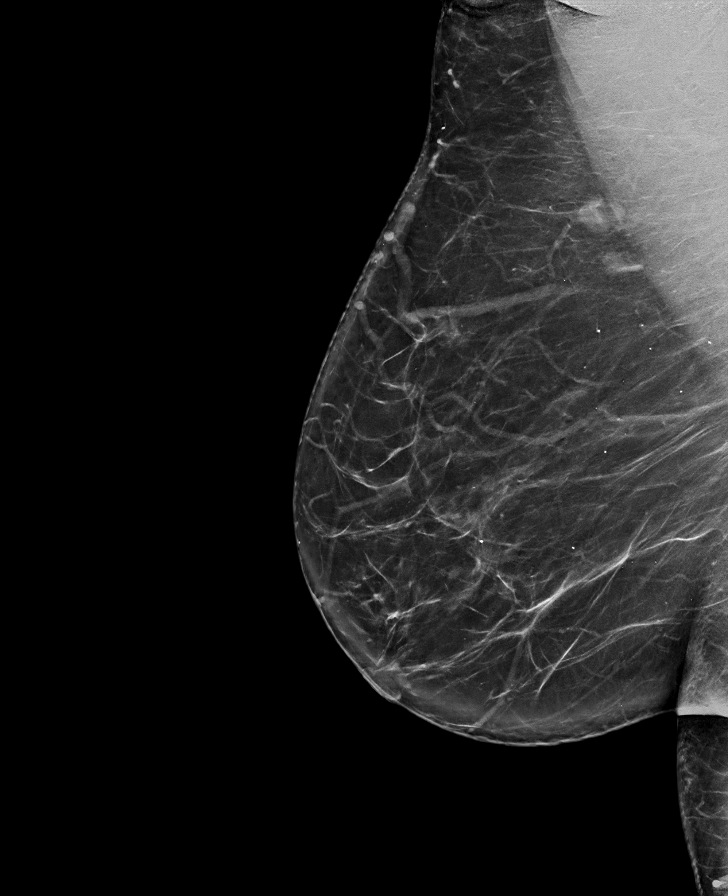

[L MLO synth-2D]
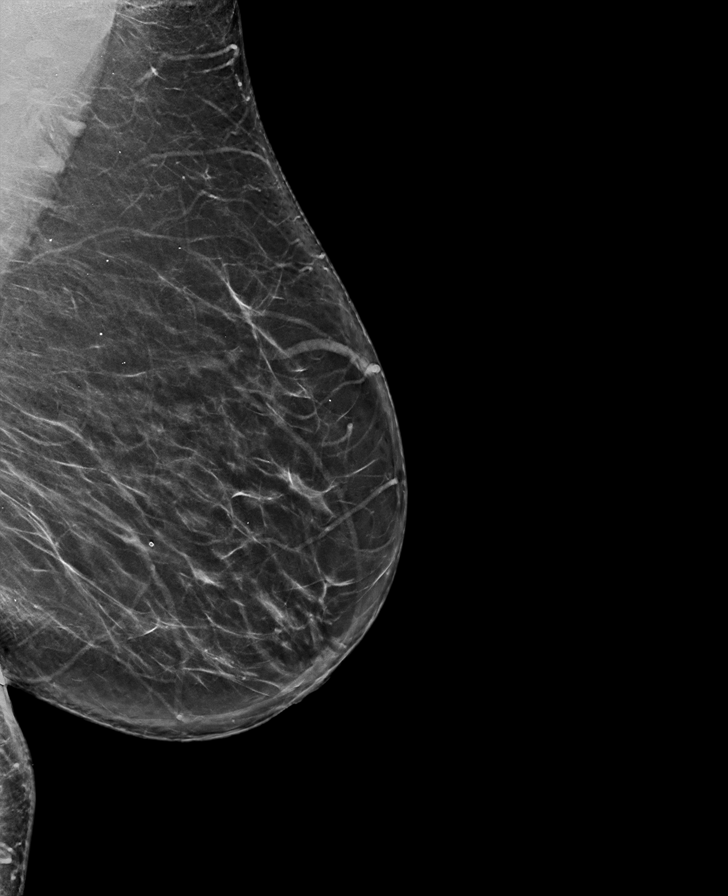

[L CC synth-2D]
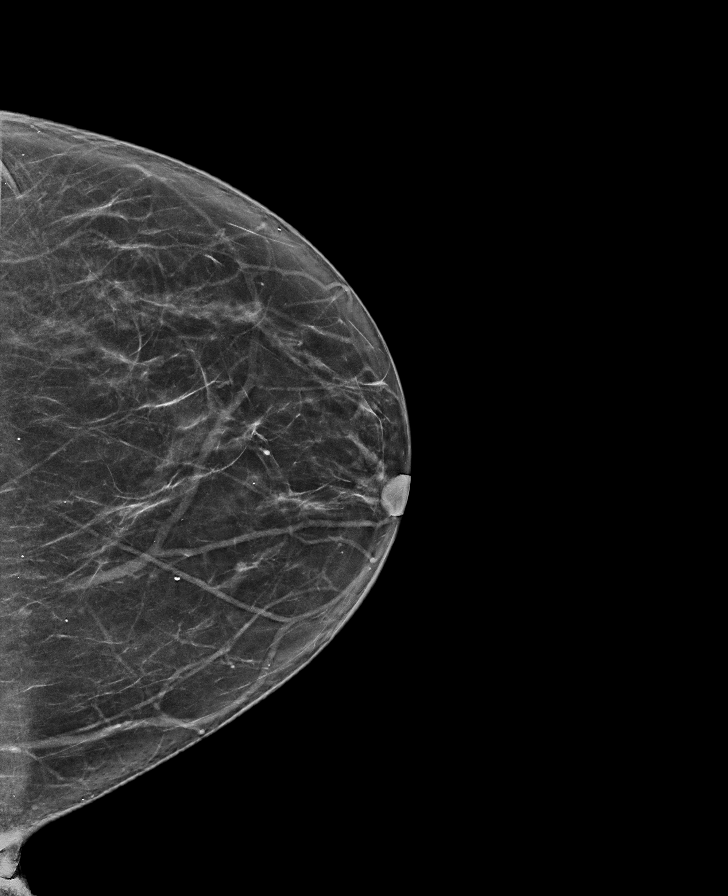

[R CC synth-2D]
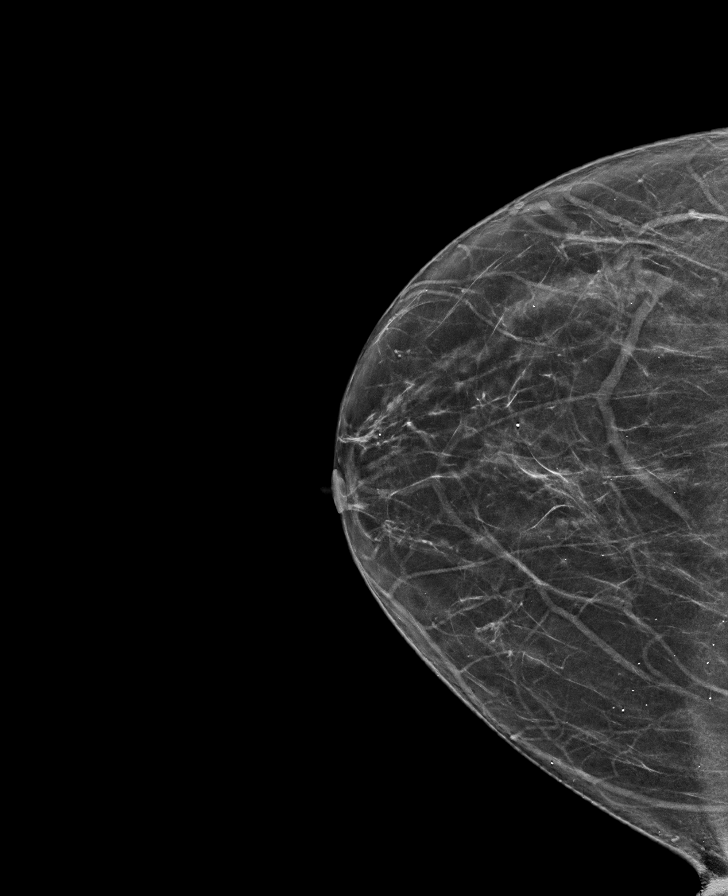

[R CC tomo · tomo slice 37/72.0]
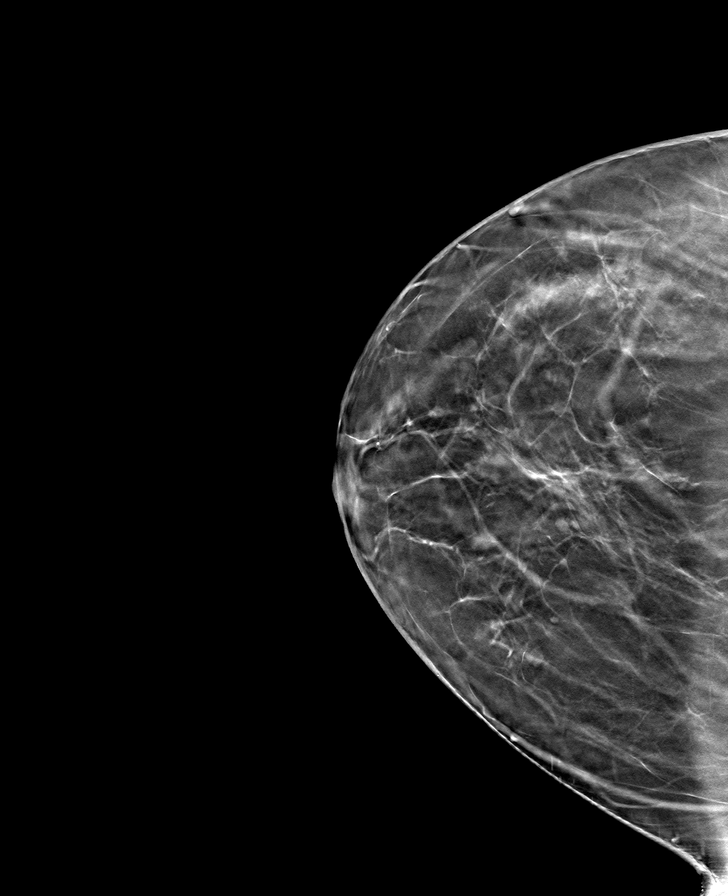

[L CC tomo · tomo slice 37/72.0]
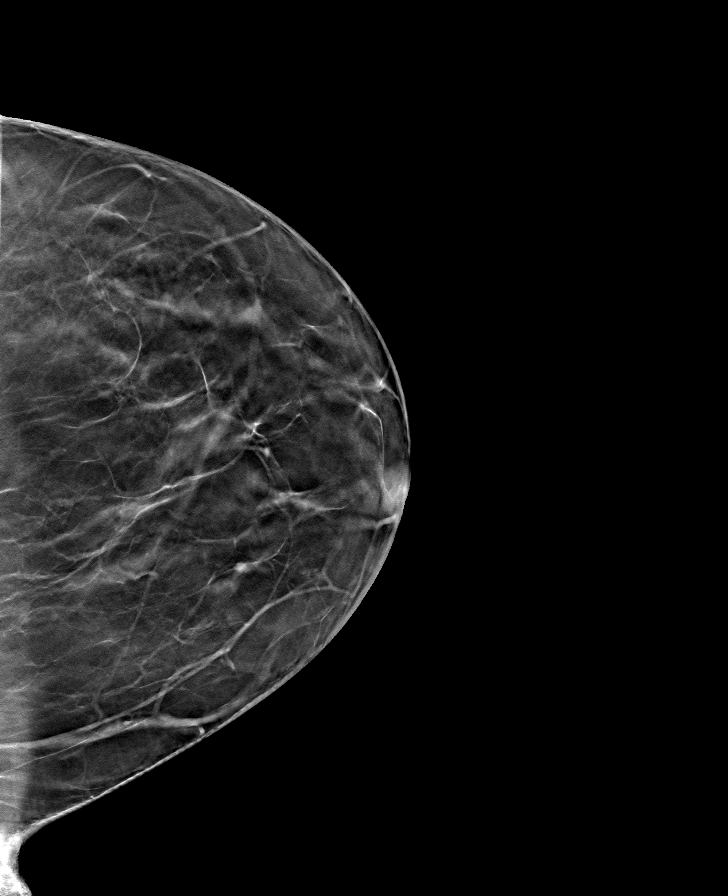

[R MLO tomo · tomo slice 45/90.0]
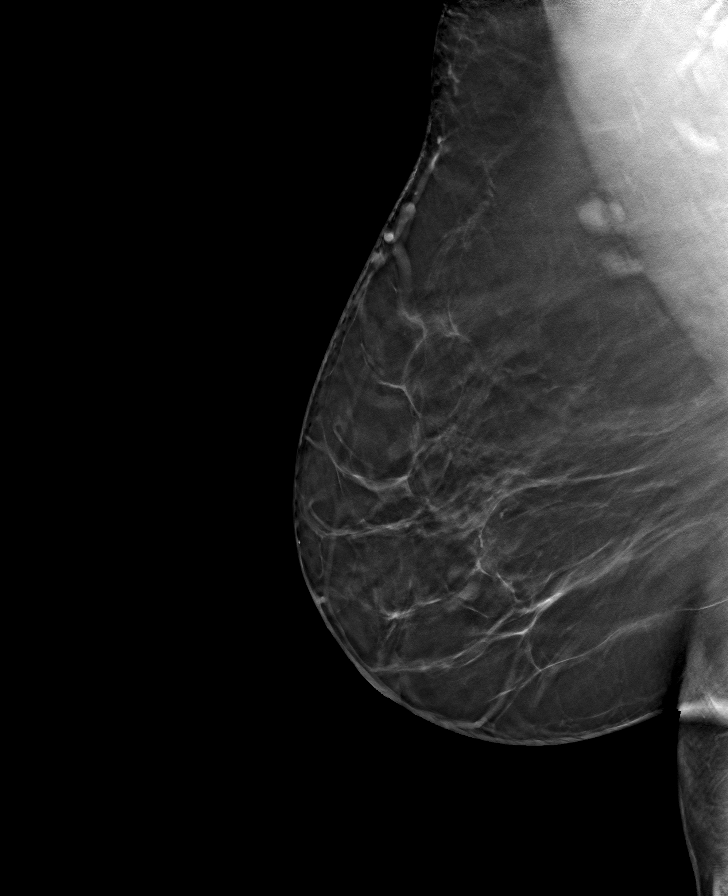

[L MLO tomo · tomo slice 41/81.0]
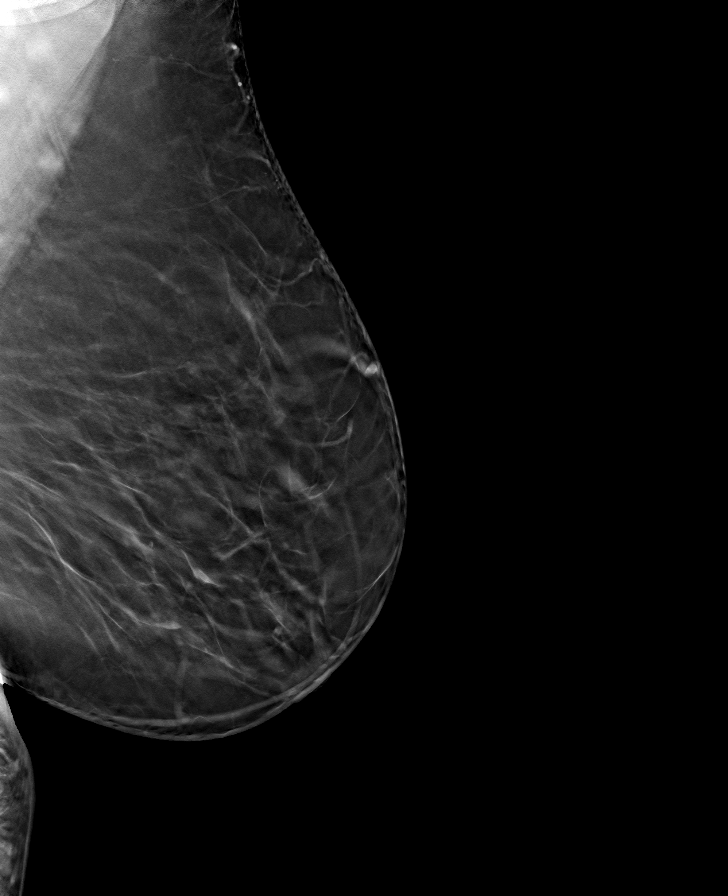

[8 of 24 positions shown; findings below may reference images not displayed]

ACR Breast Density Category b: There are scattered areas of
fibroglandular density.
FINDINGS: There are no findings suspicious for malignancy.
IMPRESSION: No mammographic evidence of malignancy. A result letter of this
screening mammogram will be mailed directly to the patient.

RECOMMENDATION:
Screening mammogram in one year. (Code:51-O-LD2)

BI-RADS CATEGORY  1: Negative.

## 2022-11-20 ENCOUNTER — Other Ambulatory Visit: Payer: Self-pay | Admitting: Nurse Practitioner

## 2022-11-20 DIAGNOSIS — Z1231 Encounter for screening mammogram for malignant neoplasm of breast: Secondary | ICD-10-CM

## 2022-12-20 ENCOUNTER — Ambulatory Visit
Admission: RE | Admit: 2022-12-20 | Discharge: 2022-12-20 | Disposition: A | Discharge status: Home / Self Care | Payer: Medicare Other | Source: Ambulatory Visit | Attending: Nurse Practitioner | Admitting: Nurse Practitioner

## 2022-12-20 DIAGNOSIS — Z1231 Encounter for screening mammogram for malignant neoplasm of breast: Secondary | ICD-10-CM

## 2023-02-18 ENCOUNTER — Ambulatory Visit (INDEPENDENT_AMBULATORY_CARE_PROVIDER_SITE_OTHER): Payer: Medicare Other | Admitting: Nurse Practitioner

## 2023-11-28 ENCOUNTER — Other Ambulatory Visit: Payer: Self-pay | Admitting: Nurse Practitioner

## 2023-11-28 DIAGNOSIS — Z1231 Encounter for screening mammogram for malignant neoplasm of breast: Secondary | ICD-10-CM

## 2023-12-24 ENCOUNTER — Encounter

## 2023-12-31 ENCOUNTER — Encounter (INDEPENDENT_AMBULATORY_CARE_PROVIDER_SITE_OTHER): Payer: Self-pay

## 2024-01-02 ENCOUNTER — Ambulatory Visit
Admission: RE | Admit: 2024-01-02 | Discharge: 2024-01-02 | Disposition: A | Discharge status: Home / Self Care | Source: Ambulatory Visit | Attending: Nurse Practitioner | Admitting: Nurse Practitioner

## 2024-01-02 DIAGNOSIS — Z1231 Encounter for screening mammogram for malignant neoplasm of breast: Secondary | ICD-10-CM | POA: Diagnosis present
